# Patient Record
Sex: Male | Born: 1955 | Race: Black or African American | Hispanic: No | Marital: Single | Smoking: Current every day smoker
Health system: Southern US, Community
[De-identification: ages and names within clinical notes are randomized; demographics above are authoritative.]

## PROBLEM LIST (undated history)

## (undated) DIAGNOSIS — I10 Essential (primary) hypertension: Secondary | ICD-10-CM

## (undated) DIAGNOSIS — M109 Gout, unspecified: Secondary | ICD-10-CM

---

## 2021-01-01 ENCOUNTER — Encounter (HOSPITAL_BASED_OUTPATIENT_CLINIC_OR_DEPARTMENT_OTHER): Payer: Self-pay

## 2021-01-01 ENCOUNTER — Other Ambulatory Visit: Payer: Self-pay

## 2021-01-01 ENCOUNTER — Emergency Department (HOSPITAL_BASED_OUTPATIENT_CLINIC_OR_DEPARTMENT_OTHER): Payer: BC Managed Care – PPO

## 2021-01-01 ENCOUNTER — Inpatient Hospital Stay (HOSPITAL_BASED_OUTPATIENT_CLINIC_OR_DEPARTMENT_OTHER)
Admission: EM | Admit: 2021-01-01 | Discharge: 2021-01-03 | DRG: 247 | Disposition: A | Discharge status: Home / Self Care | Payer: BC Managed Care – PPO | Attending: Internal Medicine | Admitting: Internal Medicine

## 2021-01-01 DIAGNOSIS — E785 Hyperlipidemia, unspecified: Secondary | ICD-10-CM | POA: Diagnosis present

## 2021-01-01 DIAGNOSIS — Z8249 Family history of ischemic heart disease and other diseases of the circulatory system: Secondary | ICD-10-CM

## 2021-01-01 DIAGNOSIS — I214 Non-ST elevation (NSTEMI) myocardial infarction: Secondary | ICD-10-CM | POA: Diagnosis not present

## 2021-01-01 DIAGNOSIS — I25110 Atherosclerotic heart disease of native coronary artery with unstable angina pectoris: Secondary | ICD-10-CM | POA: Diagnosis present

## 2021-01-01 DIAGNOSIS — Z955 Presence of coronary angioplasty implant and graft: Secondary | ICD-10-CM

## 2021-01-01 DIAGNOSIS — Z20822 Contact with and (suspected) exposure to covid-19: Secondary | ICD-10-CM | POA: Diagnosis present

## 2021-01-01 DIAGNOSIS — E876 Hypokalemia: Secondary | ICD-10-CM | POA: Diagnosis present

## 2021-01-01 DIAGNOSIS — I10 Essential (primary) hypertension: Secondary | ICD-10-CM | POA: Diagnosis present

## 2021-01-01 DIAGNOSIS — M109 Gout, unspecified: Secondary | ICD-10-CM | POA: Diagnosis present

## 2021-01-01 DIAGNOSIS — F17210 Nicotine dependence, cigarettes, uncomplicated: Secondary | ICD-10-CM | POA: Diagnosis present

## 2021-01-01 HISTORY — DX: Essential (primary) hypertension: I10

## 2021-01-01 HISTORY — DX: Gout, unspecified: M10.9

## 2021-01-01 LAB — TROPONIN I (HIGH SENSITIVITY): Troponin I (High Sensitivity): 64 ng/L — ABNORMAL HIGH (ref <18)

## 2021-01-01 LAB — BASIC METABOLIC PANEL
Anion gap: 12 (ref 5–15)
BUN: 15 mg/dL (ref 8–23)
CO2: 22 mmol/L (ref 22–32)
Calcium: 8.3 mg/dL — ABNORMAL LOW (ref 8.9–10.3)
Chloride: 102 mmol/L (ref 98–111)
Creatinine, Ser: 1.06 mg/dL (ref 0.61–1.24)
GFR, Estimated: >60 mL/min (ref >60)
Glucose, Bld: 155 mg/dL — ABNORMAL HIGH (ref 70–99)
Potassium: 3.3 mmol/L — ABNORMAL LOW (ref 3.5–5.1)
Sodium: 136 mmol/L (ref 135–145)

## 2021-01-01 LAB — CBC
HCT: 36 % — ABNORMAL LOW (ref 39.0–52.0)
Hemoglobin: 11.5 g/dL — ABNORMAL LOW (ref 13.0–17.0)
MCH: 27.4 pg (ref 26.0–34.0)
MCHC: 31.9 g/dL (ref 30.0–36.0)
MCV: 85.9 fL (ref 80.0–100.0)
Platelets: 352 10*3/uL (ref 150–400)
RBC: 4.19 MIL/uL — ABNORMAL LOW (ref 4.22–5.81)
RDW: 15 % (ref 11.5–15.5)
WBC: 8.3 10*3/uL (ref 4.0–10.5)
nRBC: 0 % (ref 0.0–0.2)

## 2021-01-01 MED ORDER — ASPIRIN 81 MG PO CHEW
324.0000 mg | CHEWABLE_TABLET | Freq: Once | ORAL | Status: AC
  Administered 2021-01-01: 324 mg via ORAL
  Filled 2021-01-01: qty 4

## 2021-01-01 MED ORDER — ASPIRIN 81 MG PO CHEW: 324.0000 mg | CHEWABLE_TABLET | Freq: Once | ORAL | Status: DC

## 2021-01-01 NOTE — ED Provider Notes (Signed)
[facility] EMERGENCY DEPARTMENT Provider Note   CSN: [id number] Arrival date & time: 01/01/21  2239     History Chief Complaint  Patient presents with   Chest Pain    Treyshaun Charlett Nose Winnett is a 65 y.o. male.  Patient is a 65 year old male with no significant past medical history.  Patient presenting today for evaluation of chest discomfort.  He reports 3 episodes over the past 2 weeks where he has developed discomfort to the right center of his chest that radiates to his right arm and makes his right arm feel numb.  He denies shortness of breath, nausea, or diaphoresis.  These episodes last approximately 10 to 15 minutes, occur while at rest, and resolve spontaneously with no alleviating factors.  Patient has no prior cardiac history.  He does report history of elevated blood pressure and has been on medications in the past, however has not been on anything in quite some time.  Other cardiac risk factors include tobacco use.  Patient smokes 1 pack of cigarettes per day.  He also reports a history of heart issues with his mother and sister, but is not certain as to what the issues were.  The history is provided by the patient.  Chest Pain Pain location:  R chest and substernal area Pain quality: pressure   Pain radiates to:  R arm Pain severity:  Moderate Duration:  15 minutes Timing:  Intermittent Progression:  Worsening Chronicity:  New Relieved by:  Nothing Worsened by:  Nothing Ineffective treatments:  None tried     Past Medical History:  Diagnosis Date   Gout    Hypertension     There are no problems to display for this patient.   History reviewed. No pertinent surgical history.     No family history on file.  Social History   Tobacco Use   Smoking status: Every Day    Types: Cigarettes   Smokeless tobacco: Never  Substance Use Topics   Alcohol use: Not Currently   Drug use: Not Currently    Home Medications Prior to Admission medications    Not on File    Allergies    Patient has no known allergies.  Review of Systems   Review of Systems  Cardiovascular:  Positive for chest pain.  All other systems reviewed and are negative.  Physical Exam Updated Vital Signs BP (!) 144/85 (BP Location: Right Arm)   Pulse 80   Temp 98.6 F (37 C) (Oral)   Resp 20   Ht 5\' 4"  (1.626 m)   Wt 81.2 kg   SpO2 100%   BMI 30.73 kg/m   Physical Exam Vitals and nursing note reviewed.  Constitutional:      General: He is not in acute distress.    Appearance: He is well-developed. He is not diaphoretic.  HENT:     Head: Normocephalic and atraumatic.  Cardiovascular:     Rate and Rhythm: Normal rate and regular rhythm.     Heart sounds: No murmur heard.   No friction rub.  Pulmonary:     Effort: Pulmonary effort is normal. No respiratory distress.     Breath sounds: Normal breath sounds. No wheezing or rales.  Abdominal:     General: Bowel sounds are normal. There is no distension.     Palpations: Abdomen is soft.     Tenderness: There is no abdominal tenderness.  Musculoskeletal:        General: Normal range of motion.  Cervical back: Normal range of motion and neck supple.     Right lower leg: No tenderness. No edema.     Left lower leg: No tenderness. No edema.  Skin:    General: Skin is warm and dry.  Neurological:     Mental Status: He is alert and oriented to person, place, and time.     Coordination: Coordination normal.    ED Results / Procedures / Treatments   Labs (all labs ordered are listed, but only abnormal results are displayed) Labs Reviewed  BASIC METABOLIC PANEL  CBC  TROPONIN I (HIGH SENSITIVITY)    EKG None  Radiology DG Chest 2 View  Result Date: 01/01/2021 CLINICAL DATA:  Intermittent chest pain for 3 weeks. EXAM: CHEST - 2 VIEW COMPARISON:  None. FINDINGS: Upper normal heart size. Normal mediastinal contours with aortic atherosclerosis. There is trace fluid in the fissures without  significant sub pulmonic effusion. No pulmonary edema. No focal airspace disease. No pneumothorax. No acute osseous abnormalities are seen. IMPRESSION: Borderline cardiomegaly. Trace fluid in the fissures. Electronically Signed   By: Narda Rutherford M.D.   On: 01/01/2021 23:13    Procedures Procedures   Medications Ordered in ED Medications  aspirin chewable tablet 324 mg (has no administration in time range)    ED Course  I have reviewed the triage vital signs and the nursing notes.  Pertinent labs & imaging results that were available during my care of the patient were reviewed by me and considered in my medical decision making (see chart for details).    MDM Rules/Calculators/A&P  Patient is a 65 year old male with no prior cardiac history presenting with complaints of chest discomfort as described in the HPI.  His initial EKG shows T wave inversions suggestive of ischemia.  His initial troponin has returned at 64.  Care discussed with Dr. Joyce Gross from cardiology who agrees patient will likely require admission and catheterization.  As there are no progressive beds available, patient will be transferred to [facility] as an ER to ER transfer.  I have spoken with Dr. Preston Fleeting who agrees to accept the patient in transfer.  Per cardiology recommendations, patient has received aspirin, statin, beta-blocker, and started on a heparin drip.  CRITICAL CARE Performed by: Geoffery Lyons Total critical care time: 40 minutes Critical care time was exclusive of separately billable procedures and treating other patients. Critical care was necessary to treat or prevent imminent or life-threatening deterioration. Critical care was time spent personally by me on the following activities: development of treatment plan with patient and/or surrogate as well as nursing, discussions with consultants, evaluation of patient's response to treatment, examination of patient, obtaining history from patient or  surrogate, ordering and performing treatments and interventions, ordering and review of laboratory studies, ordering and review of radiographic studies, pulse oximetry and re-evaluation of patient's condition.   Final Clinical Impression(s) / ED Diagnoses Final diagnoses:  None    Rx / DC Orders ED Discharge Orders     None        Geoffery Lyons, MD 01/02/21 0301

## 2021-01-01 NOTE — ED Triage Notes (Signed)
Pt c/o intermittent CP x 3 weeks-denies fever/flu sx-NAD-steady gait

## 2021-01-02 ENCOUNTER — Emergency Department (HOSPITAL_COMMUNITY): Payer: BC Managed Care – PPO

## 2021-01-02 ENCOUNTER — Encounter (HOSPITAL_COMMUNITY): Payer: Self-pay | Admitting: Internal Medicine

## 2021-01-02 ENCOUNTER — Inpatient Hospital Stay (HOSPITAL_COMMUNITY)
Admission: EM | Disposition: A | Discharge status: Home / Self Care | Payer: Self-pay | Source: Home / Self Care | Attending: Internal Medicine

## 2021-01-02 DIAGNOSIS — I214 Non-ST elevation (NSTEMI) myocardial infarction: Secondary | ICD-10-CM

## 2021-01-02 DIAGNOSIS — E876 Hypokalemia: Secondary | ICD-10-CM | POA: Diagnosis present

## 2021-01-02 DIAGNOSIS — E785 Hyperlipidemia, unspecified: Secondary | ICD-10-CM | POA: Diagnosis present

## 2021-01-02 DIAGNOSIS — Z20822 Contact with and (suspected) exposure to covid-19: Secondary | ICD-10-CM | POA: Diagnosis present

## 2021-01-02 DIAGNOSIS — I10 Essential (primary) hypertension: Secondary | ICD-10-CM | POA: Diagnosis present

## 2021-01-02 DIAGNOSIS — I25110 Atherosclerotic heart disease of native coronary artery with unstable angina pectoris: Secondary | ICD-10-CM | POA: Diagnosis present

## 2021-01-02 DIAGNOSIS — M109 Gout, unspecified: Secondary | ICD-10-CM | POA: Diagnosis present

## 2021-01-02 DIAGNOSIS — Z8249 Family history of ischemic heart disease and other diseases of the circulatory system: Secondary | ICD-10-CM | POA: Diagnosis not present

## 2021-01-02 DIAGNOSIS — Z720 Tobacco use: Secondary | ICD-10-CM

## 2021-01-02 DIAGNOSIS — I2510 Atherosclerotic heart disease of native coronary artery without angina pectoris: Secondary | ICD-10-CM | POA: Diagnosis not present

## 2021-01-02 DIAGNOSIS — F17210 Nicotine dependence, cigarettes, uncomplicated: Secondary | ICD-10-CM | POA: Diagnosis present

## 2021-01-02 HISTORY — PX: CORONARY STENT INTERVENTION: CATH118234

## 2021-01-02 HISTORY — PX: LEFT HEART CATH AND CORONARY ANGIOGRAPHY: CATH118249

## 2021-01-02 LAB — ECHOCARDIOGRAM COMPLETE
Area-P 1/2: 2.66 cm2
Height: 64 in
S' Lateral: 3.6 cm
Single Plane A2C EF: 51.7 %
Weight: 2864 oz

## 2021-01-02 LAB — LIPID PANEL
Cholesterol: 232 mg/dL — ABNORMAL HIGH (ref 0–200)
HDL: 37 mg/dL — ABNORMAL LOW (ref >40)
LDL Cholesterol: 162 mg/dL — ABNORMAL HIGH (ref 0–99)
Total CHOL/HDL Ratio: 6.3 RATIO
Triglycerides: 166 mg/dL — ABNORMAL HIGH (ref <150)
VLDL: 33 mg/dL (ref 0–40)

## 2021-01-02 LAB — CBC
HCT: 35.4 % — ABNORMAL LOW (ref 39.0–52.0)
Hemoglobin: 11 g/dL — ABNORMAL LOW (ref 13.0–17.0)
MCH: 27.6 pg (ref 26.0–34.0)
MCHC: 31.1 g/dL (ref 30.0–36.0)
MCV: 88.9 fL (ref 80.0–100.0)
Platelets: 355 10*3/uL (ref 150–400)
RBC: 3.98 MIL/uL — ABNORMAL LOW (ref 4.22–5.81)
RDW: 15 % (ref 11.5–15.5)
WBC: 9.1 10*3/uL (ref 4.0–10.5)
nRBC: 0 % (ref 0.0–0.2)

## 2021-01-02 LAB — HEPARIN LEVEL (UNFRACTIONATED)
Heparin Unfractionated: 1.03 IU/mL — ABNORMAL HIGH (ref 0.30–0.70)
Heparin Unfractionated: <0.1 IU/mL — ABNORMAL LOW (ref 0.30–0.70)

## 2021-01-02 LAB — BASIC METABOLIC PANEL
Anion gap: 9 (ref 5–15)
BUN: 12 mg/dL (ref 8–23)
CO2: 24 mmol/L (ref 22–32)
Calcium: 8.7 mg/dL — ABNORMAL LOW (ref 8.9–10.3)
Chloride: 105 mmol/L (ref 98–111)
Creatinine, Ser: 0.89 mg/dL (ref 0.61–1.24)
GFR, Estimated: >60 mL/min (ref >60)
Glucose, Bld: 108 mg/dL — ABNORMAL HIGH (ref 70–99)
Potassium: 3.8 mmol/L (ref 3.5–5.1)
Sodium: 138 mmol/L (ref 135–145)

## 2021-01-02 LAB — RESP PANEL BY RT-PCR (FLU A&B, COVID) ARPGX2
Influenza A by PCR: NEGATIVE
Influenza B by PCR: NEGATIVE
SARS Coronavirus 2 by RT PCR: NEGATIVE

## 2021-01-02 LAB — TROPONIN I (HIGH SENSITIVITY): Troponin I (High Sensitivity): 135 ng/L (ref <18)

## 2021-01-02 LAB — HEMOGLOBIN A1C
Hgb A1c MFr Bld: 6.1 % — ABNORMAL HIGH (ref 4.8–5.6)
Mean Plasma Glucose: 128.37 mg/dL

## 2021-01-02 LAB — POCT ACTIVATED CLOTTING TIME
Activated Clotting Time: 231 seconds
Activated Clotting Time: 289 seconds

## 2021-01-02 LAB — HIV ANTIBODY (ROUTINE TESTING W REFLEX): HIV Screen 4th Generation wRfx: NONREACTIVE

## 2021-01-02 LAB — BRAIN NATRIURETIC PEPTIDE: B Natriuretic Peptide: 44.3 pg/mL (ref 0.0–100.0)

## 2021-01-02 SURGERY — LEFT HEART CATH AND CORONARY ANGIOGRAPHY
Anesthesia: LOCAL

## 2021-01-02 MED ORDER — NITROGLYCERIN 1 MG/10 ML FOR IR/CATH LAB
INTRA_ARTERIAL | Status: AC
  Filled 2021-01-02: qty 10

## 2021-01-02 MED ORDER — HEPARIN BOLUS VIA INFUSION
2000.0000 [IU] | Freq: Once | INTRAVENOUS | Status: AC
  Administered 2021-01-02: 2000 [IU] via INTRAVENOUS
  Filled 2021-01-02: qty 2000

## 2021-01-02 MED ORDER — SODIUM CHLORIDE 0.9 % WEIGHT BASED INFUSION: 1.0000 mL/kg/h | INTRAVENOUS | Status: DC

## 2021-01-02 MED ORDER — SODIUM CHLORIDE 0.9 % WEIGHT BASED INFUSION
3.0000 mL/kg/h | INTRAVENOUS | Status: AC
  Administered 2021-01-02: 3 mL/kg/h via INTRAVENOUS

## 2021-01-02 MED ORDER — HEPARIN (PORCINE) 25000 UT/250ML-% IV SOLN
1200.0000 [IU]/h | INTRAVENOUS | Status: DC
  Administered 2021-01-02: 1000 [IU]/h via INTRAVENOUS
  Filled 2021-01-02: qty 250

## 2021-01-02 MED ORDER — ACETAMINOPHEN 325 MG PO TABS: 650.0000 mg | ORAL_TABLET | ORAL | Status: DC | PRN

## 2021-01-02 MED ORDER — HEPARIN (PORCINE) IN NACL 1000-0.9 UT/500ML-% IV SOLN
INTRAVENOUS | Status: AC
  Filled 2021-01-02: qty 1000

## 2021-01-02 MED ORDER — IOHEXOL 350 MG/ML SOLN
INTRAVENOUS | Status: DC | PRN
  Administered 2021-01-02: 115 mL

## 2021-01-02 MED ORDER — ONDANSETRON HCL 4 MG/2ML IJ SOLN: 4.0000 mg | Freq: Four times a day (QID) | INTRAMUSCULAR | Status: DC | PRN

## 2021-01-02 MED ORDER — MIDAZOLAM HCL 2 MG/2ML IJ SOLN
INTRAMUSCULAR | Status: AC
  Filled 2021-01-02: qty 2

## 2021-01-02 MED ORDER — METOPROLOL TARTRATE 25 MG PO TABS
12.5000 mg | ORAL_TABLET | Freq: Once | ORAL | Status: AC
  Administered 2021-01-02: 12.5 mg via ORAL
  Filled 2021-01-02: qty 1

## 2021-01-02 MED ORDER — LIDOCAINE HCL (PF) 1 % IJ SOLN
INTRAMUSCULAR | Status: DC | PRN
  Administered 2021-01-02: 2 mL

## 2021-01-02 MED ORDER — FENTANYL CITRATE (PF) 100 MCG/2ML IJ SOLN
INTRAMUSCULAR | Status: AC
  Filled 2021-01-02: qty 2

## 2021-01-02 MED ORDER — MIDAZOLAM HCL 2 MG/2ML IJ SOLN
INTRAMUSCULAR | Status: DC | PRN
  Administered 2021-01-02: 2 mg via INTRAVENOUS
  Administered 2021-01-02: 1 mg via INTRAVENOUS

## 2021-01-02 MED ORDER — TICAGRELOR 90 MG PO TABS
ORAL_TABLET | ORAL | Status: AC
  Filled 2021-01-02: qty 2

## 2021-01-02 MED ORDER — TICAGRELOR 90 MG PO TABS
ORAL_TABLET | ORAL | Status: DC | PRN
  Administered 2021-01-02: 180 mg via ORAL

## 2021-01-02 MED ORDER — NITROGLYCERIN 1 MG/10 ML FOR IR/CATH LAB
INTRA_ARTERIAL | Status: DC | PRN
  Administered 2021-01-02 (×2): 200 ug via INTRACORONARY

## 2021-01-02 MED ORDER — HEPARIN SODIUM (PORCINE) 1000 UNIT/ML IJ SOLN
INTRAMUSCULAR | Status: AC
  Filled 2021-01-02: qty 1

## 2021-01-02 MED ORDER — HYDRALAZINE HCL 20 MG/ML IJ SOLN
10.0000 mg | INTRAMUSCULAR | Status: AC | PRN
  Administered 2021-01-02: 10 mg via INTRAVENOUS

## 2021-01-02 MED ORDER — LIDOCAINE HCL (PF) 1 % IJ SOLN
INTRAMUSCULAR | Status: AC
  Filled 2021-01-02: qty 30

## 2021-01-02 MED ORDER — SODIUM CHLORIDE 0.9% FLUSH: 3.0000 mL | INTRAVENOUS | Status: DC | PRN

## 2021-01-02 MED ORDER — POTASSIUM CHLORIDE CRYS ER 20 MEQ PO TBCR
40.0000 meq | EXTENDED_RELEASE_TABLET | Freq: Once | ORAL | Status: AC
  Administered 2021-01-02: 40 meq via ORAL
  Filled 2021-01-02: qty 2

## 2021-01-02 MED ORDER — HEPARIN BOLUS VIA INFUSION
4000.0000 [IU] | Freq: Once | INTRAVENOUS | Status: AC
  Administered 2021-01-02: 4000 [IU] via INTRAVENOUS

## 2021-01-02 MED ORDER — LABETALOL HCL 5 MG/ML IV SOLN
INTRAVENOUS | Status: AC
  Filled 2021-01-02: qty 4

## 2021-01-02 MED ORDER — VERAPAMIL HCL 2.5 MG/ML IV SOLN
INTRAVENOUS | Status: DC | PRN
  Administered 2021-01-02: 10 mL via INTRA_ARTERIAL

## 2021-01-02 MED ORDER — MORPHINE SULFATE (PF) 4 MG/ML IV SOLN: 2.0000 mg | INTRAVENOUS | Status: DC | PRN

## 2021-01-02 MED ORDER — SODIUM CHLORIDE 0.9 % IV SOLN: 250.0000 mL | INTRAVENOUS | Status: DC | PRN

## 2021-01-02 MED ORDER — TICAGRELOR 90 MG PO TABS
90.0000 mg | ORAL_TABLET | Freq: Two times a day (BID) | ORAL | Status: DC
  Administered 2021-01-03 (×2): 90 mg via ORAL
  Filled 2021-01-02 (×2): qty 1

## 2021-01-02 MED ORDER — SODIUM CHLORIDE 0.9 % WEIGHT BASED INFUSION
1.0000 mL/kg/h | INTRAVENOUS | Status: DC
  Administered 2021-01-02: 1 mL/kg/h via INTRAVENOUS

## 2021-01-02 MED ORDER — HEPARIN SODIUM (PORCINE) 1000 UNIT/ML IJ SOLN
INTRAMUSCULAR | Status: DC | PRN
  Administered 2021-01-02 (×3): 4000 [IU] via INTRAVENOUS

## 2021-01-02 MED ORDER — HEPARIN (PORCINE) IN NACL 1000-0.9 UT/500ML-% IV SOLN
INTRAVENOUS | Status: DC | PRN
  Administered 2021-01-02: 500 mL

## 2021-01-02 MED ORDER — ALPRAZOLAM 0.25 MG PO TABS
0.2500 mg | ORAL_TABLET | Freq: Every evening | ORAL | Status: AC | PRN
  Administered 2021-01-02: 0.25 mg via ORAL
  Filled 2021-01-02: qty 1

## 2021-01-02 MED ORDER — NITROGLYCERIN 0.4 MG SL SUBL: 0.4000 mg | SUBLINGUAL_TABLET | SUBLINGUAL | Status: DC | PRN

## 2021-01-02 MED ORDER — METOPROLOL TARTRATE 12.5 MG HALF TABLET: 12.5000 mg | ORAL_TABLET | Freq: Two times a day (BID) | ORAL | Status: DC

## 2021-01-02 MED ORDER — SODIUM CHLORIDE 0.9% FLUSH
3.0000 mL | Freq: Two times a day (BID) | INTRAVENOUS | Status: DC
  Administered 2021-01-03: 3 mL via INTRAVENOUS

## 2021-01-02 MED ORDER — ATORVASTATIN CALCIUM 80 MG PO TABS
80.0000 mg | ORAL_TABLET | Freq: Every day | ORAL | Status: DC
  Administered 2021-01-03: 80 mg via ORAL
  Filled 2021-01-02: qty 1

## 2021-01-02 MED ORDER — LABETALOL HCL 5 MG/ML IV SOLN
10.0000 mg | INTRAVENOUS | Status: AC | PRN
  Administered 2021-01-02: 10 mg via INTRAVENOUS

## 2021-01-02 MED ORDER — HYDRALAZINE HCL 20 MG/ML IJ SOLN
INTRAMUSCULAR | Status: AC
  Filled 2021-01-02: qty 1

## 2021-01-02 MED ORDER — HEPARIN (PORCINE) IN NACL 1000-0.9 UT/500ML-% IV SOLN
INTRAVENOUS | Status: DC | PRN
Start: 2021-01-02 — End: 2021-01-02
  Administered 2021-01-02: 500 mL

## 2021-01-02 MED ORDER — LABETALOL HCL 5 MG/ML IV SOLN
20.0000 mg | Freq: Once | INTRAVENOUS | Status: AC
  Administered 2021-01-02: 20 mg via INTRAVENOUS

## 2021-01-02 MED ORDER — FENTANYL CITRATE (PF) 100 MCG/2ML IJ SOLN
INTRAMUSCULAR | Status: DC | PRN
  Administered 2021-01-02: 50 ug via INTRAVENOUS
  Administered 2021-01-02 (×2): 25 ug via INTRAVENOUS

## 2021-01-02 MED ORDER — ASPIRIN 81 MG PO CHEW
81.0000 mg | CHEWABLE_TABLET | ORAL | Status: AC
  Administered 2021-01-02: 81 mg via ORAL
  Filled 2021-01-02: qty 1

## 2021-01-02 MED ORDER — ASPIRIN EC 81 MG PO TBEC
81.0000 mg | DELAYED_RELEASE_TABLET | Freq: Every day | ORAL | Status: DC
  Administered 2021-01-03: 81 mg via ORAL
  Filled 2021-01-02: qty 1

## 2021-01-02 MED ORDER — VERAPAMIL HCL 2.5 MG/ML IV SOLN
INTRAVENOUS | Status: AC
  Filled 2021-01-02: qty 2

## 2021-01-02 SURGICAL SUPPLY — 16 items
BALLN SAPPHIRE 2.5X12 (BALLOONS) ×2
BALLN SAPPHIRE ~~LOC~~ 3.0X8 (BALLOONS) ×2 IMPLANT
BALLOON SAPPHIRE 2.5X12 (BALLOONS) ×1 IMPLANT
CATH 5FR JL3.5 JR4 ANG PIG MP (CATHETERS) ×2 IMPLANT
CATH LAUNCHER 6FR EBU3.5 (CATHETERS) ×2 IMPLANT
DEVICE RAD COMP TR BAND LRG (VASCULAR PRODUCTS) ×2 IMPLANT
GLIDESHEATH SLEND SS 6F .021 (SHEATH) ×2 IMPLANT
GUIDEWIRE INQWIRE 1.5J.035X260 (WIRE) ×1 IMPLANT
INQWIRE 1.5J .035X260CM (WIRE) ×2
KIT ENCORE 26 ADVANTAGE (KITS) ×2 IMPLANT
KIT HEART LEFT (KITS) ×2 IMPLANT
PACK CARDIAC CATHETERIZATION (CUSTOM PROCEDURE TRAY) ×2 IMPLANT
STENT ONYX FRONTIER 2.75X12 (Permanent Stent) ×2 IMPLANT
TRANSDUCER W/STOPCOCK (MISCELLANEOUS) ×2 IMPLANT
TUBING CIL FLEX 10 FLL-RA (TUBING) ×2 IMPLANT
WIRE COUGAR XT STRL 190CM (WIRE) ×2 IMPLANT

## 2021-01-02 NOTE — Progress Notes (Signed)
ANTICOAGULATION CONSULT NOTE - Initial Consult  Pharmacy Consult for Heparin  Indication: chest pain/ACS  No Known Allergies  Patient Measurements: Height: 5\' 4"  (162.6 cm) Weight: 81.2 kg (179 lb) IBW/kg (Calculated) : 59.2  Vital Signs: Temp: 97.7 F (36.5 C) (08/03 0149) Temp Source: Oral (08/03 0149) BP: 132/89 (08/03 0149) Pulse Rate: 70 (08/03 0149)  Labs: Recent Labs    01/01/21 2314 01/02/21 0040  HGB 11.5*  --   HCT 36.0*  --   PLT 352  --   CREATININE 1.06  --   TROPONINIHS 64* 135*    Estimated Creatinine Clearance: 69.5 mL/min (by C-G formula based on SCr of 1.06 mg/dL).   Medical History: Past Medical History:  Diagnosis Date   Gout    Hypertension     Assessment: 65 y/o M with chest pain and elevated troponin. Starting heparin per pharmacy. Hgb 11.5. Renal function good. Trop 64>>135. No anti-coagulation PTA.    Goal of Therapy:  Heparin level 0.3-0.7 units/ml Monitor platelets by anticoagulation protocol: Yes   Plan:  Heparin 4000 units BOLUS Start heparin drip at 1000 units/hr 0800 Heparin level Daily CBC/Heparin level Monitor for bleeding  68, PharmD, BCPS Clinical Pharmacist Phone: [phone]

## 2021-01-02 NOTE — Progress Notes (Signed)
Resting quietly w/eyes closed. BP slowly coming down.

## 2021-01-02 NOTE — ED Provider Notes (Signed)
65 year old male received as a transfer from Dr. Judd Lien  "Henry Carson is a 65 y.o. male.   Patient is a 65 year old male with no significant past medical history.  Patient presenting today for evaluation of chest discomfort.  He reports 3 episodes over the past 2 weeks where he has developed discomfort to the right center of his chest that radiates to his right arm and makes his right arm feel numb.  He denies shortness of breath, nausea, or diaphoresis.  These episodes last approximately 10 to 15 minutes, occur while at rest, and resolve spontaneously with no alleviating factors.   Patient has no prior cardiac history.  He does report history of elevated blood pressure and has been on medications in the past, however has not been on anything in quite some time.  Other cardiac risk factors include tobacco use.  Patient smokes 1 pack of cigarettes per day.  He also reports a history of heart issues with his mother and sister, but is not certain as to what the issues were.   The history is provided by the patient.  Chest Pain Pain location:  R chest and substernal area Pain quality: pressure   Pain radiates to:  R arm Pain severity:  Moderate Duration:  15 minutes Timing:  Intermittent Progression:  Worsening Chronicity:  New Relieved by:  Nothing Worsened by:  Nothing Ineffective treatments:  None tried"  Physical Exam  BP 121/81   Pulse 69   Temp 97.7 F (36.5 C) (Oral)   Resp 17   Ht 5\' 4"  (1.626 m)   Wt 81.2 kg   SpO2 99%   BMI 30.73 kg/m   Physical Exam Vitals and nursing note reviewed.  Constitutional:      Appearance: He is well-developed.     Comments: Resting comfortably in bed.  No acute distress.  HENT:     Head: Normocephalic and atraumatic.  Cardiovascular:     Pulses: Normal pulses.     Heart sounds: Normal heart sounds. No murmur heard.   No friction rub. No gallop.  Pulmonary:     Effort: Pulmonary effort is normal.  Musculoskeletal:     Cervical  back: Neck supple.  Neurological:     Mental Status: He is alert and oriented to person, place, and time.     Cranial Nerves: No cranial nerve deficit.  Psychiatric:        Behavior: Behavior normal.    ED Course/Procedures     Procedures  MDM   65 year old male received as a transfer [facility].  He was initially seen by Dr. 68.  Please see his note for further work-up and medical decision making.  Patient presented with chest discomfort.  EKG showed T wave inversions suggestive of ischemia.  Initial troponin was 64 --> 135.  Patient has been seen by Dr. Judd Lien, cardiology, who will accept the patient for admission.  Patient has received ASA, statin, beta-blocker, and started on a heparin drip per cardiology recommendations.  The patient appears reasonably stabilized for admission considering the current resources, flow, and capabilities available in the ED at this time, and I doubt any other [facility] requiring further screening and/or treatment in the ED prior to admission.        [facility], PA-C 01/02/21 0410    03/04/21, MD 01/02/21 [phone]

## 2021-01-02 NOTE — Progress Notes (Addendum)
Progress Note  Patient Name: Henry Carson Date of Encounter: 01/02/2021  [facility] Cardiologist: None   Subjective   No chest pain this morning.   Inpatient Medications    Scheduled Meds:  aspirin  324 mg Oral Once   [START ON 01/03/2021] aspirin EC  81 mg Oral Daily   atorvastatin  80 mg Oral Daily   [START ON 01/03/2021] metoprolol tartrate  12.5 mg Oral BID   Continuous Infusions:  heparin 1,000 Units/hr (01/02/21 0309)   PRN Meds: acetaminophen, nitroGLYCERIN, ondansetron (ZOFRAN) IV   Vital Signs    Vitals:   01/02/21 0500 01/02/21 0600 01/02/21 0630 01/02/21 0700  BP: 118/76 120/69 124/70 136/69  Pulse: 64 69 66 64  Resp: 19 15 17 16   Temp:      TempSrc:      SpO2: 97% 99% 98% 99%  Weight:      Height:        Intake/Output Summary (Last 24 hours) at 01/02/2021 0742 Last data filed at 01/02/2021 0309 Gross per 24 hour  Intake 65.12 ml  Output --  Net 65.12 ml   Last 3 Weights 01/01/2021  Weight (lbs) 179 lb  Weight (kg) 81.194 kg      Telemetry    SR - Personally Reviewed  ECG    SR with deep TWI in anterolateral leads - Personally Reviewed  Physical Exam   GEN: No acute distress.   Neck: No JVD Cardiac: RRR, no murmurs, rubs, or gallops.  Respiratory: Clear to auscultation bilaterally. GI: Soft, nontender, non-distended  MS: No edema; No deformity. Neuro:  Nonfocal  Psych: Normal affect   Labs    High Sensitivity Troponin:   Recent Labs  Lab 01/01/21 2314 01/02/21 0040  TROPONINIHS 64* 135*      Chemistry Recent Labs  Lab 01/01/21 2314 01/02/21 0251  NA 136 138  K 3.3* 3.8  CL 102 105  CO2 22 24  GLUCOSE 155* 108*  BUN 15 12  CREATININE 1.06 0.89  CALCIUM 8.3* 8.7*  GFRNONAA >60 >60  ANIONGAP 12 9     Hematology Recent Labs  Lab 01/01/21 2314 01/02/21 0251  WBC 8.3 9.1  RBC 4.19* 3.98*  HGB 11.5* 11.0*  HCT 36.0* 35.4*  MCV 85.9 88.9  MCH 27.4 27.6  MCHC 31.9 31.1  RDW 15.0 15.0  PLT 352 355     BNP Recent Labs  Lab 01/01/21 2314  BNP 44.3     DDimer No results for input(s): DDIMER in the last 168 hours.   Radiology    DG Chest 2 View  Result Date: 01/01/2021 CLINICAL DATA:  Intermittent chest pain for 3 weeks. EXAM: CHEST - 2 VIEW COMPARISON:  None. FINDINGS: Upper normal heart size. Normal mediastinal contours with aortic atherosclerosis. There is trace fluid in the fissures without significant sub pulmonic effusion. No pulmonary edema. No focal airspace disease. No pneumothorax. No acute osseous abnormalities are seen. IMPRESSION: Borderline cardiomegaly. Trace fluid in the fissures. Electronically Signed   By: 03/03/2021 M.D.   On: 01/01/2021 23:13    Cardiac Studies   N/a   Patient Profile     65 y.o. male with PMH of HTN and gout who presented with NSTEMI.   Assessment & Plan    NSTEMI: hsTn 64>>135. No chest pain this morning. Reports episodes mostly developed with rest. He works in [employer]. EKG with deep TWI in anterolateral leads. RFs with tobacco use. He does not see a PCP on  regular basis.  -- currently on IV heparin, asa, statin and BB -- plan for cardiac cath today -- Lipids, Hgb A1c pending  Shared Decision Making/Informed Consent{ The risks [stroke (1 in 1000), death (1 in 1000), kidney failure [usually temporary] (1 in 500), bleeding (1 in 200), allergic reaction [possibly serious] (1 in 200)], benefits (diagnostic support and management of coronary artery disease) and alternatives of a cardiac catheterization were discussed in detail with Mr. Sobh and he is willing to proceed.  HTN: reports being told he has a hx of HTN, but has not been on any medications PTA -- currently on low dose BB  Hypokalemia: resolved  Tobacco use: cessation advised  For questions or updates, please contact [facility] Please consult [email or web address] for contact info under        Signed, Lindsay Roberts, NP  01/02/2021, 7:42 AM    Agree with note by  Lindsay Roberts NP-C  64-year-old divorced African-American male without prior cardiac history with risk factors that include greater than 50 pack years of ongoing tobacco abuse presented with unstable angina and non-STEMI.  He is currently pain-free on IV heparin.  His EKG shows diffuse deep anterolateral T wave inversion consistent with proximal LAD disease but I suspect he has multivessel disease.  Exam is benign.  Plan for left heart cath later today. The patient understands that risks included but are not limited to stroke (1 in 1000), death (1 in 1000), kidney failure [usually temporary] (1 in 500), bleeding (1 in 200), allergic reaction [possibly serious] (1 in 200).  The patient understands and agrees to proceed   [name] Geisel, M.D., FACP, FACC, FAHA, FSCAI [facility] [address] [place], [place]  [address]  [phone] 01/02/2021 9:21 AM   

## 2021-01-02 NOTE — Progress Notes (Signed)
  Echocardiogram 2D Echocardiogram has been performed.  Henry Carson 01/02/2021, 11:32 AM

## 2021-01-02 NOTE — H&P (View-Only) (Signed)
Progress Note  Patient Name: Henry Carson Date of Encounter: 01/02/2021  [facility] Cardiologist: None   Subjective   No chest pain this morning.   Inpatient Medications    Scheduled Meds:  aspirin  324 mg Oral Once   [START ON 01/03/2021] aspirin EC  81 mg Oral Daily   atorvastatin  80 mg Oral Daily   [START ON 01/03/2021] metoprolol tartrate  12.5 mg Oral BID   Continuous Infusions:  heparin 1,000 Units/hr (01/02/21 0309)   PRN Meds: acetaminophen, nitroGLYCERIN, ondansetron (ZOFRAN) IV   Vital Signs    Vitals:   01/02/21 0500 01/02/21 0600 01/02/21 0630 01/02/21 0700  BP: 118/76 120/69 124/70 136/69  Pulse: 64 69 66 64  Resp: 19 15 17 16   Temp:      TempSrc:      SpO2: 97% 99% 98% 99%  Weight:      Height:        Intake/Output Summary (Last 24 hours) at 01/02/2021 0742 Last data filed at 01/02/2021 0309 Gross per 24 hour  Intake 65.12 ml  Output --  Net 65.12 ml   Last 3 Weights 01/01/2021  Weight (lbs) 179 lb  Weight (kg) 81.194 kg      Telemetry    SR - Personally Reviewed  ECG    SR with deep TWI in anterolateral leads - Personally Reviewed  Physical Exam   GEN: No acute distress.   Neck: No JVD Cardiac: RRR, no murmurs, rubs, or gallops.  Respiratory: Clear to auscultation bilaterally. GI: Soft, nontender, non-distended  MS: No edema; No deformity. Neuro:  Nonfocal  Psych: Normal affect   Labs    High Sensitivity Troponin:   Recent Labs  Lab 01/01/21 2314 01/02/21 0040  TROPONINIHS 64* 135*      Chemistry Recent Labs  Lab 01/01/21 2314 01/02/21 0251  NA 136 138  K 3.3* 3.8  CL 102 105  CO2 22 24  GLUCOSE 155* 108*  BUN 15 12  CREATININE 1.06 0.89  CALCIUM 8.3* 8.7*  GFRNONAA >60 >60  ANIONGAP 12 9     Hematology Recent Labs  Lab 01/01/21 2314 01/02/21 0251  WBC 8.3 9.1  RBC 4.19* 3.98*  HGB 11.5* 11.0*  HCT 36.0* 35.4*  MCV 85.9 88.9  MCH 27.4 27.6  MCHC 31.9 31.1  RDW 15.0 15.0  PLT 352 355     BNP Recent Labs  Lab 01/01/21 2314  BNP 44.3     DDimer No results for input(s): DDIMER in the last 168 hours.   Radiology    DG Chest 2 View  Result Date: 01/01/2021 CLINICAL DATA:  Intermittent chest pain for 3 weeks. EXAM: CHEST - 2 VIEW COMPARISON:  None. FINDINGS: Upper normal heart size. Normal mediastinal contours with aortic atherosclerosis. There is trace fluid in the fissures without significant sub pulmonic effusion. No pulmonary edema. No focal airspace disease. No pneumothorax. No acute osseous abnormalities are seen. IMPRESSION: Borderline cardiomegaly. Trace fluid in the fissures. Electronically Signed   By: 03/03/2021 M.D.   On: 01/01/2021 23:13    Cardiac Studies   N/a   Patient Profile     65 y.o. male with PMH of HTN and gout who presented with NSTEMI.   Assessment & Plan    NSTEMI: hsTn 64>>135. No chest pain this morning. Reports episodes mostly developed with rest. He works in [employer]. EKG with deep TWI in anterolateral leads. RFs with tobacco use. He does not see a PCP on  regular basis.  -- currently on IV heparin, asa, statin and BB -- plan for cardiac cath today -- Lipids, Hgb A1c pending  Shared Decision Making/Informed Consent{ The risks [stroke (1 in 1000), death (1 in 1000), kidney failure [usually temporary] (1 in 500), bleeding (1 in 200), allergic reaction [possibly serious] (1 in 200)], benefits (diagnostic support and management of coronary artery disease) and alternatives of a cardiac catheterization were discussed in detail with Henry Carson and he is willing to proceed.  HTN: reports being told he has a hx of HTN, but has not been on any medications PTA -- currently on low dose BB  Hypokalemia: resolved  Tobacco use: cessation advised  For questions or updates, please contact [facility] Please consult [email or web address] for contact info under        Signed, Laverda Page, NP  01/02/2021, 7:42 AM    Agree with note by  Laverda Page NP-C  65 year old divorced African-American male without prior cardiac history with risk factors that include greater than 50 pack years of ongoing tobacco abuse presented with unstable angina and non-STEMI.  He is currently pain-free on IV heparin.  His EKG shows diffuse deep anterolateral T wave inversion consistent with proximal LAD disease but I suspect he has multivessel disease.  Exam is benign.  Plan for left heart cath later today. The patient understands that risks included but are not limited to stroke (1 in 1000), death (1 in 1000), kidney failure [usually temporary] (1 in 500), bleeding (1 in 200), allergic reaction [possibly serious] (1 in 200).  The patient understands and agrees to proceed   Runell Gess, M.D., FACP, [facility], Kathryne Eriksson [facility] [address] [place], [place]  [address]  [phone] 01/02/2021 9:21 AM

## 2021-01-02 NOTE — ED Notes (Signed)
Pt bib [facility] from [facility]. Pt presented with chest pain x3 weeks. Positive troponin of 135. Pt arrives on heparin gtt at 1000 units/hr. Pt not c/o any chest pain on arrival.

## 2021-01-02 NOTE — Progress Notes (Signed)
ANTICOAGULATION CONSULT NOTE  Pharmacy Consult for Heparin  Indication: chest pain/ACS  No Known Allergies  Patient Measurements: Height: 5\' 4"  (162.6 cm) Weight: 81.2 kg (179 lb) IBW/kg (Calculated) : 59.2  Vital Signs: Temp: 97.7 F (36.5 C) (08/03 0149) Temp Source: Oral (08/03 0149) BP: 136/69 (08/03 0700) Pulse Rate: 64 (08/03 0700)  Labs: Recent Labs    01/01/21 2314 01/02/21 0040 01/02/21 0251 01/02/21 0822  HGB 11.5*  --  11.0*  --   HCT 36.0*  --  35.4*  --   PLT 352  --  355  --   HEPARINUNFRC  --   --   --  1.03*  CREATININE 1.06  --  0.89  --   TROPONINIHS 64* 135*  --   --      Estimated Creatinine Clearance: 82.8 mL/min (by C-G formula based on SCr of 0.89 mg/dL).   Medical History: Past Medical History:  Diagnosis Date   Gout    Hypertension     Assessment: 65 y/o M with chest pain and elevated troponin. Starting heparin per pharmacy. Hgb 11.5. Renal function good. Trop 64>>135. No anti-coagulation PTA.   Initial heparin level came back elevated at 1.03 (drawn from same site of infusion, although flushed and heparin held for 20 minutes). Repeat heparin level drawn form opposite site of infusion at undetectable. Hgb 11, plt 355. No s/sx of bleeding or infusion issues.   Goal of Therapy:  Heparin level 0.3-0.7 units/ml Monitor platelets by anticoagulation protocol: Yes   Plan:  Heparin 2000 units BOLUS Increase heparin drip to 1200 units/hr Order heparin level in 6 hr Daily CBC/Heparin level Monitor for bleeding  68, PharmD, BCCCP Clinical Pharmacist  Phone: [phone] 01/02/2021 10:01 AM  Please check AMION for all [facility] Pharmacy phone numbers After 10:00 PM, call Main Pharmacy [phone]

## 2021-01-02 NOTE — ED Notes (Signed)
Pt to stress test

## 2021-01-02 NOTE — Plan of Care (Signed)

## 2021-01-02 NOTE — H&P (Signed)
Cardiology Admission History and Physical:   Patient ID: Henry Carson MRN: [id number]; DOB: 07/09/1955   Admission date: 01/01/2021  PCP:  Pcp, No   [facility] Providers Cardiologist:  None   Chief Complaint: "My chest started hurting tonight and then it came right back"  Patient Profile:   [name] is a 65 y.o. male with hx of essential hypertension and gout who is being seen 01/02/2021 for the evaluation of NSTEMI.  History of Present Illness:   [name] has no significant history of cardiovascular disease other than recently diagnosed hypertension for which she is currently not on any medication for.  His family history is notable for his sister who developed end-stage heart failure in her 16s requiring an LVAD and subsequent heart transplant.  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  He is quite active at home, doing all his yard work without any functionally limiting symptoms.  About 2 months ago he had an episode of right-sided chest pressure at rest that he had never had before.  This was associated with right arm numbness and tingling.  The episode resolved after about 10 to 15 minutes.  He had a second similar episode about 3 weeks ago, which similarly resolved after about 10 to 15 minutes.  He had a few other episodes over the last couple of weeks as well.  This evening he again developed right-sided chest pressure, and he stood up and walked around which did resolved the chest pressure after about 10 minutes.  He became concerned, however, because about an hour later the same pressure returned.  He has never had back-to-back episodes like this and was concerned enough that he drove himself to the emergency department.  All of these episodes have happened at rest.  There is no associated shortness of breath, fatigue, diaphoresis, nausea, or vomiting.  Of note he does smoke about 1 pack/day which she has done for many  years.  He previously would drink about 1/5 of alcohol on the weekends although he quit 6 months ago because it was leading to frequent gout flares.  On presentation to the emergency department Mr. Wolfer was hemodynamically stable and chest pain-free.  His initial EKG was quite concerning because of deep anterior precordial T wave inversions.  His initial troponin was positive at 64 which later trended up to 135.  He was initially seen at the med center ED and subsequently transferred here for further evaluation management.   Past Medical History:  Diagnosis Date   Gout    Hypertension     History reviewed. No pertinent surgical history.   Medications Prior to Admission: Prior to Admission medications   Not on File     Allergies:   No Known Allergies  Social History:   Social History   Socioeconomic History   Marital status: Single    Spouse name: Not on file   Number of children: Not on file   Years of education: Not on file   Highest education level: Not on file  Occupational History   Not on file  Tobacco Use   Smoking status: Every Day    Types: Cigarettes   Smokeless tobacco: Never  Substance and Sexual Activity   Alcohol use: Not Currently   Drug use: Not Currently   Sexual activity: Not on file  Other Topics Concern   Not on file  Social History Narrative   Not on file  Social Determinants of Health   Financial Resource Strain: Not on file  Food Insecurity: Not on file  Transportation Needs: Not on file  Physical Activity: Not on file  Stress: Not on file  Social Connections: Not on file  Intimate Partner Violence: Not on file    Family History:   The patient's family history includes Congestive Heart Failure in his sister; Heart attack in his mother.    ROS:  Please see the history of present illness.  All other ROS reviewed and negative.     Physical Exam/Data:   Vitals:   01/02/21 0021 01/02/21 0100 01/02/21 0149 01/02/21 0239  BP: 126/73  121/69 132/89 119/76  Pulse: 81 72 70 75  Resp:  18 16 20   Temp:   97.7 F (36.5 C)   TempSrc:   Oral   SpO2:  98% 99% 99%  Weight:      Height:       No intake or output data in the 24 hours ending 01/02/21 0247 Last 3 Weights 01/01/2021  Weight (lbs) 179 lb  Weight (kg) 81.194 kg     Body mass index is 30.73 kg/m.  General:  Well nourished, well developed, in no acute distress HEENT: normal Lymph: no adenopathy Neck: no JVD Endocrine:  No thryomegaly Vascular: No carotid bruits; FA pulses 2+ bilaterally without bruits  Cardiac:  normal S1, S2; RRR; no murmur  Lungs:  clear to auscultation bilaterally, no wheezing, rhonchi or rales  Abd: soft, nontender, no hepatomegaly  Ext: no edema Musculoskeletal:  No deformities, BUE and BLE strength normal and equal Skin: warm and dry  Neuro:  CNs 2-12 intact, no focal abnormalities noted Psych:  Normal affect   EKG:  The ECG that was done 01/02/21 was personally reviewed and demonstrates NSR, deep anterior precordial T wave inversions  Relevant CV Studies: None  Laboratory Data:  High Sensitivity Troponin:   Recent Labs  Lab 01/01/21 2314 01/02/21 0040  TROPONINIHS 64* 135*      Chemistry Recent Labs  Lab 01/01/21 2314  NA 136  K 3.3*  CL 102  CO2 22  GLUCOSE 155*  BUN 15  CREATININE 1.06  CALCIUM 8.3*  GFRNONAA >60  ANIONGAP 12    No results for input(s): PROT, ALBUMIN, AST, ALT, ALKPHOS, BILITOT in the last 168 hours. Hematology Recent Labs  Lab 01/01/21 2314  WBC 8.3  RBC 4.19*  HGB 11.5*  HCT 36.0*  MCV 85.9  MCH 27.4  MCHC 31.9  RDW 15.0  PLT 352   BNP Recent Labs  Lab 01/01/21 2314  BNP 44.3    DDimer No results for input(s): DDIMER in the last 168 hours.   Radiology/Studies:  DG Chest 2 View  Result Date: 01/01/2021 CLINICAL DATA:  Intermittent chest pain for 3 weeks. EXAM: CHEST - 2 VIEW COMPARISON:  None. FINDINGS: Upper normal heart size. Normal mediastinal contours with aortic  atherosclerosis. There is trace fluid in the fissures without significant sub pulmonic effusion. No pulmonary edema. No focal airspace disease. No pneumothorax. No acute osseous abnormalities are seen. IMPRESSION: Borderline cardiomegaly. Trace fluid in the fissures. Electronically Signed   By: 03/03/2021 M.D.   On: 01/01/2021 23:13     Assessment and Plan:   #NSTEMI: - Sputtering rest anginal pain over the last 2 months, with 2 back to back episodes this evening. Significant risk factors for CAD, including hypertension and long term tobacco abuse. Biomarkers elevated with deep anterior T wave inversions  -  Fortunately Mr. Bienkowski remains chest pain free with stable hemodynamics, but I suspect he likely has significant LAD disease  - Start heparin ggt - Already loaded with ASA. ASA 81 mg to start in the AM. Hold P2Y12 given potential for surgical disease  - Start atorva 80  - Start metoprolol 12.5 mg BID - Check A1c, lipids - Echo in the AM - LHC in the AM (keep NPO)  #Tobacco Abuse - Strongly encouraged complete cessation  - Would refer to smoking cessation at d/c   Risk Assessment/Risk Scores:  TIMI Risk Score for Unstable Angina or Non-ST Elevation MI:   The patient's TIMI risk score is 2, which indicates a 8% risk of all cause mortality, new or recurrent myocardial infarction or need for urgent revascularization in the next 14 days.{  Severity of Illness: The appropriate patient status for this patient is INPATIENT. Inpatient status is judged to be reasonable and necessary in order to provide the required intensity of service to ensure the patient's safety. The patient's presenting symptoms, physical exam findings, and initial radiographic and laboratory data in the context of their chronic comorbidities is felt to place them at high risk for further clinical deterioration. Furthermore, it is not anticipated that the patient will be medically stable for discharge from the hospital  within 2 midnights of admission. The following factors support the patient status of inpatient.   " The patient's presenting symptoms include chest pain. " The worrisome physical exam findings include N/A. " The initial radiographic and laboratory data are worrisome because of elevated troponin, T wave inversions. " The chronic co-morbidities include hypertension, tobacco abuse.   * I certify that at the point of admission it is my clinical judgment that the patient will require inpatient hospital care spanning beyond 2 midnights from the point of admission due to high intensity of service, high risk for further deterioration and high frequency of surveillance required.*   For questions or updates, please contact [facility] Please consult [email or web address] for contact info under     Signed, Livingston Diones, MD  01/02/2021 2:47 AM

## 2021-01-02 NOTE — Interval H&P Note (Signed)
Cath Lab Visit (complete for each Cath Lab visit)  Clinical Evaluation Leading to the Procedure:   ACS: Yes.    Non-ACS:    Anginal Classification: CCS IV  Anti-ischemic medical therapy: Minimal Therapy (1 class of medications)  Non-Invasive Test Results: No non-invasive testing performed  Prior CABG: No previous CABG      History and Physical Interval Note:  01/02/2021 2:37 PM  Henry Carson  has presented today for surgery, with the diagnosis of nstemi.  The various methods of treatment have been discussed with the patient and family. After consideration of risks, benefits and other options for treatment, the patient has consented to  Procedure(s): LEFT HEART CATH AND CORONARY ANGIOGRAPHY (N/A) as a surgical intervention.  The patient's history has been reviewed, patient examined, no change in status, stable for surgery.  I have reviewed the patient's chart and labs.  Questions were answered to the patient's satisfaction.     Tonny Bollman

## 2021-01-03 ENCOUNTER — Telehealth: Payer: Self-pay | Admitting: General Practice

## 2021-01-03 ENCOUNTER — Other Ambulatory Visit (HOSPITAL_COMMUNITY): Payer: Self-pay

## 2021-01-03 LAB — CBC
HCT: 36.3 % — ABNORMAL LOW (ref 39.0–52.0)
Hemoglobin: 11.6 g/dL — ABNORMAL LOW (ref 13.0–17.0)
MCH: 27.2 pg (ref 26.0–34.0)
MCHC: 32 g/dL (ref 30.0–36.0)
MCV: 85.2 fL (ref 80.0–100.0)
Platelets: 358 10*3/uL (ref 150–400)
RBC: 4.26 MIL/uL (ref 4.22–5.81)
RDW: 14.9 % (ref 11.5–15.5)
WBC: 8.3 10*3/uL (ref 4.0–10.5)
nRBC: 0 % (ref 0.0–0.2)

## 2021-01-03 LAB — BASIC METABOLIC PANEL
Anion gap: 10 (ref 5–15)
BUN: 9 mg/dL (ref 8–23)
CO2: 22 mmol/L (ref 22–32)
Calcium: 8.8 mg/dL — ABNORMAL LOW (ref 8.9–10.3)
Chloride: 103 mmol/L (ref 98–111)
Creatinine, Ser: 0.94 mg/dL (ref 0.61–1.24)
GFR, Estimated: >60 mL/min (ref >60)
Glucose, Bld: 86 mg/dL (ref 70–99)
Potassium: 3.6 mmol/L (ref 3.5–5.1)
Sodium: 135 mmol/L (ref 135–145)

## 2021-01-03 MED ORDER — ASPIRIN 81 MG PO TBEC
81.0000 mg | DELAYED_RELEASE_TABLET | Freq: Every day | ORAL | 3 refills | Status: AC
  Filled 2021-01-03: qty 90, 90d supply, fill #0

## 2021-01-03 MED ORDER — ATORVASTATIN CALCIUM 80 MG PO TABS
80.0000 mg | ORAL_TABLET | Freq: Every day | ORAL | 3 refills | Status: AC
  Filled 2021-01-03: qty 90, 90d supply, fill #0

## 2021-01-03 MED ORDER — NITROGLYCERIN 0.4 MG SL SUBL: 0.4000 mg | SUBLINGUAL_TABLET | SUBLINGUAL | 12 refills | Status: DC | PRN

## 2021-01-03 MED ORDER — METOPROLOL SUCCINATE ER 25 MG PO TB24: 25.0000 mg | ORAL_TABLET | Freq: Every day | ORAL | 3 refills | Status: DC

## 2021-01-03 MED ORDER — ASPIRIN 81 MG PO TBEC
81.0000 mg | DELAYED_RELEASE_TABLET | Freq: Every day | ORAL | 11 refills | Status: DC
Start: 2021-01-03 — End: 2021-01-03

## 2021-01-03 MED ORDER — ATORVASTATIN CALCIUM 80 MG PO TABS: 80.0000 mg | ORAL_TABLET | Freq: Every day | ORAL | 3 refills | Status: DC

## 2021-01-03 MED ORDER — METOPROLOL SUCCINATE ER 25 MG PO TB24
25.0000 mg | ORAL_TABLET | Freq: Every day | ORAL | Status: DC
  Administered 2021-01-03: 25 mg via ORAL
  Filled 2021-01-03: qty 1

## 2021-01-03 MED ORDER — NITROGLYCERIN 0.4 MG SL SUBL
0.4000 mg | SUBLINGUAL_TABLET | SUBLINGUAL | 12 refills | Status: AC | PRN
  Filled 2021-01-03: qty 25, 8d supply, fill #0

## 2021-01-03 MED ORDER — TICAGRELOR 90 MG PO TABS: 90.0000 mg | ORAL_TABLET | Freq: Two times a day (BID) | ORAL | 11 refills | Status: DC

## 2021-01-03 MED ORDER — METOPROLOL SUCCINATE ER 25 MG PO TB24
25.0000 mg | ORAL_TABLET | Freq: Every day | ORAL | 11 refills | Status: AC
  Filled 2021-01-03: qty 30, 30d supply, fill #0

## 2021-01-03 MED ORDER — TICAGRELOR 90 MG PO TABS
90.0000 mg | ORAL_TABLET | Freq: Two times a day (BID) | ORAL | 11 refills | Status: AC
  Filled 2021-01-03: qty 60, 30d supply, fill #0

## 2021-01-03 MED FILL — Labetalol HCl IV Soln Prefilled Syringe 20 MG/4ML (5 MG/ML): INTRAVENOUS | Qty: 4 | Status: AC

## 2021-01-03 NOTE — Telephone Encounter (Signed)
Encounter not needed

## 2021-01-03 NOTE — TOC Benefit Eligibility Note (Signed)
Transition of Care [facility]) Benefit Eligibility Note    Patient Details  Name: Henry Carson MRN: [id number] Date of Birth: [birth year]   Medication/Dose: BRILINTA  90 MG BID  Covered?: Yes  Tier: 3 Drug  Prescription Coverage Preferred Pharmacy: [facility]  Spoke with Person/Company/Phone Number:: ADRENNA   @ [employer] #  [phone]  Co-Pay: $45.00  Prior Approval: No  Deductible: Unmet Rosana Fret)       Mardene Sayer Phone Number: 01/03/2021, 12:11 PM

## 2021-01-03 NOTE — TOC Benefit Eligibility Note (Signed)
Patient Product/process development scientist completed.    The patient is currently admitted and upon discharge could be taking Brilinta 90 mg.  The current 30 day co-pay is, $25.00.   The patient is insured through [employer]     Roland Earl, CPhT Pharmacy Patient Advocate Specialist [facility] Antimicrobial Stewardship Team Direct Number: [phone]  Fax: [phone]

## 2021-01-03 NOTE — Care Management (Addendum)
01-03-21 2119 Benefits check submitted for Brilinta. Case Manager will follow for cost.   [id number] [facility] to provide the patient with 30 day free Brilinta. Staff RN to provide the patient with the Brilinta discount card.

## 2021-01-03 NOTE — Progress Notes (Signed)
CARDIAC REHAB PHASE I   PRE:  Rate/Rhythm: 80 SR  BP:  Sitting: 142/81      SaO2: 100 RA  MODE:  Ambulation: 470 ft   POST:  Rate/Rhythm: 99 SR  BP:  Sitting: 151/78    SaO2: 93 RA   Pt ambulated 448ft in hallway independently with slow, steady gait. Pt able to hold conversation throughout walk, denies CP, SOB, or dizziness. Pt educated on importance of ASA, Brilinta, statin, and NTG. Pt given heart healthy diet and smoking cessation tip sheet. Reviewed site care, restrictions, and exercise guidelines. Will refer to [facility] [place] at pts request as he spends most of his time here during the week.  [id number] Reynold Bowen, RN BSN 01/03/2021 10:13 AM

## 2021-01-03 NOTE — Discharge Summary (Addendum)
Discharge Summary    Patient ID: Henry Carson MRN: [id number]; DOB: Jan 19, 1956  Admit date: 01/01/2021 Discharge date: 01/03/2021  PCP:  Pcp, No   [facility] Providers Cardiologist:  Nanetta Batty, MD   {  Discharge Diagnoses    Active Problems:   NSTEMI (non-ST elevated myocardial infarction) [facility])   CAD   HLD   Mild LV Dysfunction   Diagnostic Studies/Procedures    Echo 01/03/2021 1. Left ventricular ejection fraction, by estimation, is 45 to 50%. The  left ventricle has mildly decreased function. The left ventricle  demonstrates regional wall motion abnormalities (see scoring  diagram/findings for description). Left ventricular  diastolic parameters are consistent with Grade I diastolic dysfunction  (impaired relaxation).   2. Right ventricular systolic function is normal. The right ventricular  size is normal.   3. Left atrial size was mildly dilated.   4. The mitral valve is normal in structure. Trivial mitral valve  regurgitation. No evidence of mitral stenosis.   5. The aortic valve is normal in structure. Aortic valve regurgitation is  not visualized. No aortic stenosis is present.   6. The inferior vena cava is normal in size with greater than 50%  respiratory variability, suggesting right atrial pressure of 3 mmHg.   CORONARY STENT INTERVENTION  LEFT HEART CATH AND CORONARY ANGIOGRAPHY    Conclusion      Mid LAD lesion is 80% stenosed.   2nd Diag lesion is 50% stenosed.   A drug-eluting stent was successfully placed using a STENT ONYX FRONTIER [id number].   Post intervention, there is a 0% residual stenosis.   1.  Severe stenosis of the mid LAD, treated successfully with PCI using a 2.75 x 12 mm resolute Onyx DES 2.  Moderate stenosis at the ostium of the second diagonal branch (large vessel) 3.  Widely patent left main, left circumflex, and RCA 4.  Mild segmental LV dysfunction noted on echo assessment with anteroapical akinesis, LVEF  approximately 45%   Recommend: Dual antiplatelet therapy with aspirin and ticagrelor x12 months without interruption as tolerated, aggressive secondary risk reduction measures   Diagnostic Dominance: Right    Intervention    History of Present Illness     Henry Carson is a 65 y.o. male with history of hypertension, gout, tobacco abuse and family history of CAD presented for evaluation of chest pain and found to have non-STEMI.  His family history is notable for his sister who developed end-stage heart failure in her 32s requiring an LVAD and subsequent heart transplant.   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  He is quite active at home, doing all his yard work without any functionally limiting symptoms.  About 2 months ago he had an episode of right-sided chest pressure at rest that he had never had before.  This was associated with right arm numbness and tingling.  The episode resolved after about 10 to 15 minutes.  He had a second similar episode about 3 weeks ago, which similarly resolved after about 10 to 15 minutes.  He had a few other episodes over the last couple of weeks as well.  on 8/2 he again developed right-sided chest pressure, and he stood up and walked around which did resolved the chest pressure after about 10 minutes.  He became concerned, however, because about an hour later the same pressure returned.  He has never had back-to-back episodes like this and was concerned enough  that he drove himself to the emergency department.  All of these episodes have happened at rest.  There is no associated shortness of breath, fatigue, diaphoresis, nausea, or vomiting.  Of note he does smoke about 1 pack/day which she has done for many years.  He previously would drink about 1/5 of alcohol on the weekends although he quit 6 months ago because it was leading to frequent gout flares.   On presentation to the emergency department [name] was hemodynamically stable and chest pain-free.  His initial EKG was quite concerning because of deep anterior precordial T wave inversions.  His initial troponin was positive at 64 which later trended up to 135.  He was initially seen at the med center ED and subsequently transferred here for further evaluation management.  Hospital Course     Consultants: None  hsTn 64>>135. EKG with deep TWI in anterolateral leads.  Treated with heparin without recurrent pain. Cath showed severe stenosis of the mid LAD, treated successfully with PCI using a 2.75 x 12 mm resolute Onyx DES. Moderate stenosis at the ostium of the second diagonal branch (large vessel) >> medical management.  No recurrent chest pain.  Echocardiogram showed mild LV dysfunction with EF of 45 to 50% and grade 1 diastolic dysfunction.  Metoprolol tartrate transition to succinate at discharge.  Uptitrate further in outpatient setting with addition of ACE or ARB if needed.  Hopefully EF will improve with revascularization and beta-blocker.  Started on high intensity statin with plan to repeat lipid panel and LFTs in 6 to 8 weeks.  LDL 162. Goal less than 70. He will ambulate with cardiac rehab prior to discharge.  Strongly encourage/recommended smoking cessation.  The patient been seen by Dr. Maricsa Sammons toAllyson Sabalday and deemed ready for discharge home. All follow-up appointments have been scheduled. Discharge medications are listed below.    Did the patient have an acute coronary syndrome (MI, NSTEMI, STEMI, etc) this admission?:  Yes                               AHA/ACC Clinical Performance & Quality Measures: Aspirin prescribed? - Yes ADP Receptor Inhibitor (Plavix/Clopidogrel, Brilinta/Ticagrelor or Effient/Prasugrel) prescribed (includes medically managed patients)? - Yes Beta Blocker prescribed? - Yes High Intensity Statin (Lipitor 40-80mg  or Crestor 20-40mg ) prescribed? - Yes EF assessed during THIS hospitalization? - Yes For EF <40%,  was ACEI/ARB prescribed? - Not Applicable (EF >/= 40%) For EF <40%, Aldosterone Antagonist (Spironolactone or Eplerenone) prescribed? - Not Applicable (EF >/= 40%) Cardiac Rehab Phase II ordered (including medically managed patients)? - Yes      _____________  Discharge Vitals Blood pressure 135/73, pulse 70, temperature 97.9 F (36.6 C), temperature source Oral, resp. rate 16, height 5\' 4"  (1.626 m), weight 81.2 kg, SpO2 100 %.  Filed Weights   01/01/21 2248  Weight: 81.2 kg    Labs & Radiologic Studies    CBC Recent Labs    01/02/21 0251 01/03/21 0247  WBC 9.1 8.3  HGB 11.0* 11.6*  HCT 35.4* 36.3*  MCV 88.9 85.2  PLT 355 358   Basic Metabolic Panel Recent Labs    16/03/9607/03/22 0251 01/03/21 0247  NA 138 135  K 3.8 3.6  CL 105 103  CO2 24 22  GLUCOSE 108* 86  BUN 12 9  CREATININE 0.89 0.94  CALCIUM 8.7* 8.8*    High Sensitivity Troponin:   Recent Labs  Lab 01/01/21 2314 01/02/21  0040  TROPONINIHS 64* 135*     Hemoglobin A1C Recent Labs    01/01/21 2314  HGBA1C 6.1*   Fasting Lipid Panel Recent Labs    01/01/21 2314  CHOL 232*  HDL 37*  LDLCALC 162*  TRIG 166*  CHOLHDL 6.3    _____________  DG Chest 2 View  Result Date: 01/01/2021 CLINICAL DATA:  Intermittent chest pain for 3 weeks. EXAM: CHEST - 2 VIEW COMPARISON:  None. FINDINGS: Upper normal heart size. Normal mediastinal contours with aortic atherosclerosis. There is trace fluid in the fissures without significant sub pulmonic effusion. No pulmonary edema. No focal airspace disease. No pneumothorax. No acute osseous abnormalities are seen. IMPRESSION: Borderline cardiomegaly. Trace fluid in the fissures. Electronically Signed   By: Narda Rutherford M.D.   On: 01/01/2021 23:13   CARDIAC CATHETERIZATION  Result Date: 01/02/2021 Formatting of this result is different from the original.   Mid LAD lesion is 80% stenosed.   2nd Diag lesion is 50% stenosed.   A drug-eluting stent was successfully  placed using a STENT ONYX FRONTIER [id number].   Post intervention, there is a 0% residual stenosis. 1.  Severe stenosis of the mid LAD, treated successfully with PCI using a 2.75 x 12 mm resolute Onyx DES 2.  Moderate stenosis at the ostium of the second diagonal branch (large vessel) 3.  Widely patent left main, left circumflex, and RCA 4.  Mild segmental LV dysfunction noted on echo assessment with anteroapical akinesis, LVEF approximately 45% Recommend: Dual antiplatelet therapy with aspirin and ticagrelor x12 months without interruption as tolerated, aggressive secondary risk reduction measures   ECHOCARDIOGRAM COMPLETE  Result Date: 01/02/2021    ECHOCARDIOGRAM REPORT   Patient Name:   Henry Carson [facility] Date of Exam: 01/02/2021 Medical Rec #:  [id number]           Height:       64.0 in Accession #:    [id number]          Weight:       179.0 lb Date of Birth:  08-05-1955          BSA:          1.866 m Patient Age:    64 years            BP:           136/69 mmHg Patient Gender: M                   HR:           59 bpm. Exam Location:  Inpatient Procedure: 2D Echo Indications:    NSTEMI  History:        Patient has no prior history of Echocardiogram examinations.  Sonographer:    Delcie Roch Referring Phys: [id number] CHRISTOPHER A WROBEL IMPRESSIONS  1. Left ventricular ejection fraction, by estimation, is 45 to 50%. The left ventricle has mildly decreased function. The left ventricle demonstrates regional wall motion abnormalities (see scoring diagram/findings for description). Left ventricular diastolic parameters are consistent with Grade I diastolic dysfunction (impaired relaxation).  2. Right ventricular systolic function is normal. The right ventricular size is normal.  3. Left atrial size was mildly dilated.  4. The mitral valve is normal in structure. Trivial mitral valve regurgitation. No evidence of mitral stenosis.  5. The aortic valve is normal in structure. Aortic valve regurgitation is not  visualized. No aortic stenosis is present.  6. The inferior vena cava is normal in size  with greater than 50% respiratory variability, suggesting right atrial pressure of 3 mmHg. FINDINGS  Left Ventricle: Left ventricular ejection fraction, by estimation, is 45 to 50%. The left ventricle has mildly decreased function. The left ventricle demonstrates regional wall motion abnormalities. The left ventricular internal cavity size was normal in size. There is no left ventricular hypertrophy. Left ventricular diastolic parameters are consistent with Grade I diastolic dysfunction (impaired relaxation).  LV Wall Scoring: The apical lateral segment, apical septal segment, and apex are akinetic. Right Ventricle: The right ventricular size is normal. No increase in right ventricular wall thickness. Right ventricular systolic function is normal. Left Atrium: Left atrial size was mildly dilated. Right Atrium: Right atrial size was normal in size. Pericardium: There is no evidence of pericardial effusion. Mitral Valve: The mitral valve is normal in structure. Trivial mitral valve regurgitation. No evidence of mitral valve stenosis. Tricuspid Valve: The tricuspid valve is normal in structure. Tricuspid valve regurgitation is not demonstrated. No evidence of tricuspid stenosis. Aortic Valve: The aortic valve is normal in structure. Aortic valve regurgitation is not visualized. No aortic stenosis is present. Pulmonic Valve: The pulmonic valve was normal in structure. Pulmonic valve regurgitation is not visualized. No evidence of pulmonic stenosis. Aorta: The aortic root is normal in size and structure. Venous: The inferior vena cava is normal in size with greater than 50% respiratory variability, suggesting right atrial pressure of 3 mmHg. IAS/Shunts: No atrial level shunt detected by color flow Doppler.  LEFT VENTRICLE PLAX 2D LVIDd:         5.30 cm      Diastology LVIDs:         3.60 cm      LV e' medial:    7.18 cm/s LV PW:          1.10 cm      LV E/e' medial:  9.1 LV IVS:        1.10 cm      LV e' lateral:   7.94 cm/s LVOT diam:     2.10 cm      LV E/e' lateral: 8.2 LV SV:         54 LV SV Index:   29 LVOT Area:     3.46 cm  LV Volumes (MOD) LV vol d, MOD A2C: 109.0 ml LV vol s, MOD A2C: 52.6 ml LV SV MOD A2C:     56.4 ml RIGHT VENTRICLE            IVC RV S prime:     7.72 cm/s  IVC diam: 1.50 cm TAPSE (M-mode): 2.2 cm LEFT ATRIUM             Index       RIGHT ATRIUM           Index LA diam:        3.50 cm 1.88 cm/m  RA Area:     15.20 cm LA Vol (A2C):   80.2 ml 42.97 ml/m RA Volume:   39.20 ml  21.00 ml/m LA Vol (A4C):   63.8 ml 34.19 ml/m LA Biplane Vol: 73.5 ml 39.38 ml/m  AORTIC VALVE LVOT Vmax:   72.00 cm/s LVOT Vmean:  45.000 cm/s LVOT VTI:    0.155 m  AORTA Ao Root diam: 2.90 cm Ao Asc diam:  3.10 cm MITRAL VALVE MV Area (PHT): 2.66 cm    SHUNTS MV Decel Time: 285 msec    Systemic VTI:  0.16 m MV E velocity: 65.10 cm/s  Systemic Diam: 2.10 cm MV A velocity: 53.10 cm/s MV E/A ratio:  1.23 Henry Schultz MD Electronically signed by Henry Schultz MD Signature Date/Time: 01/02/2021/2:29:05 PM    Final    Disposition   Pt is being discharged home today in good condition.  Follow-up Plans & Appointments     Follow-up Information     [facility] Follow up on 01/15/2021.   Specialty: Cardiology Why: @11 :15am for hospital follow up with Dr. PA/NP. Please arrive 15 minutes early Contact information: [address] [place] [phone]               Discharge Instructions     AMB Referral to Cardiac Rehabilitation - Phase II   Complete by: As directed    Diagnosis: NSTEMI   After initial evaluation and assessments completed: Virtual Based Care may be provided alone or in conjunction with Phase 2 Cardiac Rehab based on patient barriers.: Yes   Diet - low sodium heart healthy   Complete by: As directed    Discharge instructions    Complete by: As directed    NO HEAVY LIFTING (>10lbs) X 2 WEEKS. NO SEXUAL ACTIVITY X 2 WEEKS. NO DRIVING X 1 WEEK. NO SOAKING BATHS, HOT TUBS, POOLS, ETC., X 7 DAYS.  Return to work after seen in clinic.   Increase activity slowly   Complete by: As directed        Discharge Medications   Allergies as of 01/03/2021   No Known Allergies      Medication List     TAKE these medications    aspirin 81 MG EC tablet Take 1 tablet (81 mg total) by mouth daily. Swallow whole.   atorvastatin 80 MG tablet Commonly known as: LIPITOR Take 1 tablet (80 mg total) by mouth daily.   metoprolol succinate 25 MG 24 hr tablet Commonly known as: TOPROL-XL Take 1 tablet (25 mg total) by mouth daily.   nitroGLYCERIN 0.4 MG SL tablet Commonly known as: NITROSTAT Place 1 tablet (0.4 mg total) under the tongue every 5 (five) minutes x 3 doses as needed for chest pain.   ticagrelor 90 MG Tabs tablet Commonly known as: BRILINTA Take 1 tablet (90 mg total) by mouth 2 (two) times daily.           Outstanding Labs/Studies   Consider OP f/u labs 6-8 weeks given statin initiation this admission.   Duration of Discharge Encounter   Greater than 30 minutes including physician time.  Signed, 03/05/2021, PA 01/03/2021, 9:28 AM  Agree with note by 03/05/2021 PA-C  Mr. Morini was admitted with a non-STEMI.  His peak troponin was 135.  He underwent diagnostic coronary angiography by Dr. Shana Chute via the right radial approach revealing a high-grade ruptured plaque in the mid LAD after diagonal branch which was successfully stented.  He has mild LV dysfunction.  He is stable for discharge on DAPT (aspirin/Brilinta) uninterrupted for 12 months, beta-blocker, high-dose statin therapy.  We talked about smoking cessation.  We will recheck a 2D echo in 3 months.  Exam is benign.  Henry Carson, M.D., Henry Carson, [facility], [facility] [address] [employer], [place]  [place]  [phone] 01/03/2021 10:08 AM

## 2021-01-07 ENCOUNTER — Telehealth (HOSPITAL_COMMUNITY): Payer: Self-pay

## 2021-01-07 NOTE — Telephone Encounter (Signed)
Attempted to call patient in regards to Cardiac Rehab - LM on VM 

## 2021-01-07 NOTE — Telephone Encounter (Signed)
Pt insurance is active and benefits verified through [employer] Co-pay 0, DED $2,000/0 met, out of pocket $6,000/$33.13 met, co-insurance 20%. no pre-authorization required. Passport, 01/07/2021'@11' :35am, REF# [id number]   Will contact patient to see if he is interested in the Cardiac Rehab Program. If interested, patient will need to complete follow up appt. Once completed, patient will be contacted for scheduling upon review by the RN Navigator.

## 2021-01-07 NOTE — Telephone Encounter (Signed)
Pt called back and stated that he is interested in the cardiac rehab, I advised pt of his insurance benefits and went over how the program works and pt understood. Will call pt back once he has completed his f/u appt.

## 2021-01-14 ENCOUNTER — Other Ambulatory Visit (HOSPITAL_COMMUNITY): Payer: Self-pay

## 2021-01-14 ENCOUNTER — Telehealth (HOSPITAL_COMMUNITY): Payer: Self-pay | Admitting: Pharmacist

## 2021-01-14 NOTE — Telephone Encounter (Signed)
Pharmacy Transitions of Care Follow-up Telephone Call   Date of discharge: 01/03/2021  Discharge Diagnosis: MI   How have you been since you were released from the hospital?  Good   Medication changes made at discharge:  - START: asa, atorvastatin, metoprolol, Nitrostat and Brilinta  - STOPPED: N/A  - CHANGED: N/A   Medication changes verified by the patient?  (Yes/     Medication Accessibility:   Home [facility]: [facility] on [address]   Was the patient provided with refills on discharged medications? Yes    Have all prescriptions been transferred from [facility] to home pharmacy?  Yes   Is the patient able to afford medications? Yes Notable copays:  Eligible patient assistance:      Medication Review: TICAGRELOR (BRILINTA) Ticagrelor 90 mg BID initiated on 01/03/2021.  - Educated patient on expected duration of therapy of aspirin/ with ticagrelor. Advised patient that dose of ticagrelor will be /reduced after /. Aspirin will be /continued indefinitely/discontinued. - Discussed importance of taking medication around the same time every day, - Reviewed potential DDIs with patient - Advised patient of medications to avoid (NSAIDs, aspirin maintenance doses>100 mg daily) - Educated that Tylenol (acetaminophen) will be the preferred analgesic to prevent risk of bleeding  - Emphasized importance of monitoring for signs and symptoms of bleeding (abnormal bruising, prolonged bleeding, nose bleeds, bleeding from gums, discolored urine, black tarry stools)  - Educated patient to notify doctor if shortness of breath or abnormal heartbeat occur - Advised patient to alert all providers of antiplatelet therapy prior to starting a new medication or having a procedure    Follow-up Appointments:  If their condition worsens, is the pt aware to call PCP or go to the Emergency Dept.? Yes   Final Patient Assessment: Patient doing well.  Reviewed medications.  Transfers completed.

## 2021-01-14 NOTE — Telephone Encounter (Signed)
Unable to reach patient, LVM.

## 2021-01-14 NOTE — Progress Notes (Addendum)
Cardiology Office Note:    Date:  01/15/2021   ID:  Henry ClampLevern Mcoy Carson, DOB January 08, 1956, MRN [id number]  PCP:  Oneita HurtPcp, No   [facility] Providers Cardiologist:  Nanetta BattyJonathan Berry, MD      Referring MD: No ref. provider found   Follow-up for coronary artery disease status post NSTEMI with PCI and DES to his mid LAD.  History of Present Illness:    Henry Carson is a 65 y.o. male with a hx of non-STEMI, HLD, HTN, gout, tobacco abuse, and family history of coronary artery disease and mild LV dysfunction.  He presented to the emergency department on 01/02/2021.  He reported that 2 months prior he had an episode of right-sided chest pressure that he had never had before.  The episode was associated with right arm numbness and tingling.  It lasted for around 15-20 minutes and resolved.  He had another episode that was similar to the first around 3 weeks ago.  It again resolved without intervention in about 10 to 15 minutes.  He reported that on 01/01/2021 he began to develop right-sided chest pressure.  He stood up and walked around which resolved his chest pressure in about 10 minutes.  About an hour later he became concerned because the pressure returned.  He reported that he had never had back-to-back episodes.  This prompted him to drive himself to the emergency department.  He reported that all of his other episodes that happened while he was at rest.  He had no associated shortness of breath, fatigue, diaphoresis, nausea or vomiting.  He reported that he was smoking about 1 pack/day and had been on for many years.  He also had been drinking about 1/5 of alcohol on the weekends although he quit doing this 6 months prior because it was leading to frequent gout flares.  His initial EKG showed deep anterior precordial T wave inversions his initial troponin was positive at 64 and trended up to 135.  He was [facility] seen at the med center emergency department and [facility]  for cardiac catheterization on 01/02/2021.  He underwent PCI with DES x1 to his mid LAD.  He was also noted to have 50% stenosis in the second diagonal.  He presents to the clinic today for follow-up evaluation states he feels well.  He has been increasing his physical activity and reports that his energy level and breathing have improved.  He reports compliance with his medications.  He denies further instances of chest discomfort.  He reports that he has cut back on his smoking and is only smoked 4-5 cigarettes since being discharged from the hospital.  He requests nicotine patches.  He does not have any questions or is experiencing any side effects from his medications.  I will give him a salty 6 diet sheet, have him continue to increase his physical activity, prescribed nicotine patches, order fasting lipids and LFTs in 4 weeks, and have him follow-up with Dr. Allyson SabalBerry.  Today he denies chest pain, shortness of breath, lower extremity edema, fatigue, palpitations, melena, hematuria, hemoptysis, diaphoresis, weakness, presyncope, syncope, orthopnea, and PND.     Past Medical History:  Diagnosis Date   Gout    Hypertension     Past Surgical History:  Procedure Laterality Date   CORONARY STENT INTERVENTION N/A 01/02/2021   Procedure: CORONARY STENT INTERVENTION;  Surgeon: Tonny Bollmanooper, Michael, MD;  [facility];  Service: Cardiovascular;  Laterality: N/A;   LEFT HEART CATH  AND CORONARY ANGIOGRAPHY N/A 01/02/2021   Procedure: LEFT HEART CATH AND CORONARY ANGIOGRAPHY;  Surgeon: Tonny Bollman, MD;  Location: [facility] INVASIVE CV LAB;  Service: Cardiovascular;  Laterality: N/A;    Current Medications: Current Meds  Medication Sig   aspirin 81 MG EC tablet Take 1 tablet (81 mg total) by mouth daily. Swallow whole.   atorvastatin (LIPITOR) 80 MG tablet Take 1 tablet (80 mg total) by mouth daily.   metoprolol succinate (TOPROL-XL) 25 MG 24 hr tablet Take 1 tablet (25 mg total) by mouth daily.    nitroGLYCERIN (NITROSTAT) 0.4 MG SL tablet Place 1 tablet (0.4 mg total) under the tongue every 5 (five) minutes x 3 doses as needed for chest pain.   ticagrelor (BRILINTA) 90 MG TABS tablet Take 1 tablet (90 mg total) by mouth 2 (two) times daily.     Allergies:   Patient has no known allergies.   Social History   Socioeconomic History   Marital status: Single    Spouse name: Not on file   Number of children: Not on file   Years of education: Not on file   Highest education level: Not on file  Occupational History   Not on file  Tobacco Use   Smoking status: Every Day    Types: Cigarettes   Smokeless tobacco: Never  Substance and Sexual Activity   Alcohol use: Not Currently   Drug use: Not Currently   Sexual activity: Not on file  Other Topics Concern   Not on file  Social History Narrative   Not on file   Social Determinants of Health   Financial Resource Strain: Not on file  Food Insecurity: Not on file  Transportation Needs: Not on file  Physical Activity: Not on file  Stress: Not on file  Social Connections: Not on file     Family History: The patient's family history includes Congestive Heart Failure in his sister; Heart attack in his mother.  ROS:   Please see the history of present illness.      All other systems reviewed and are negative.   Risk Assessment/Calculations:           Physical Exam:    VS:  BP 126/76 (BP Location: Left Arm, Patient Position: Sitting, Cuff Size: Normal)   Pulse 66   Ht 5' 4.5" (1.638 m)   Wt 177 lb 6.4 oz (80.5 kg)   BMI 29.98 kg/m     Wt Readings from Last 3 Encounters:  01/15/21 177 lb 6.4 oz (80.5 kg)  01/01/21 179 lb (81.2 kg)     GEN:  Well nourished, well developed in no acute distress HEENT: Normal NECK: No JVD; No carotid bruits LYMPHATICS: No lymphadenopathy CARDIAC: RRR, no murmurs, rubs, gallops RESPIRATORY:  Clear to auscultation without rales, wheezing or rhonchi  ABDOMEN: Soft, non-tender,  non-distended MUSCULOSKELETAL:  No edema; No deformity  SKIN: Warm and dry NEUROLOGIC:  Alert and oriented x 3 PSYCHIATRIC:  Normal affect    EKGs/Labs/Other Studies Reviewed:    The following studies were reviewed today:   Echocardiogram 01/02/2021 IMPRESSIONS     1. Left ventricular ejection fraction, by estimation, is 45 to 50%. The  left ventricle has mildly decreased function. The left ventricle  demonstrates regional wall motion abnormalities (see scoring  diagram/findings for description). Left ventricular  diastolic parameters are consistent with Grade I diastolic dysfunction  (impaired relaxation).   2. Right ventricular systolic function is normal. The right ventricular  size is normal.  3. Left atrial size was mildly dilated.   4. The mitral valve is normal in structure. Trivial mitral valve  regurgitation. No evidence of mitral stenosis.   5. The aortic valve is normal in structure. Aortic valve regurgitation is  not visualized. No aortic stenosis is present.   6. The inferior vena cava is normal in size with greater than 50%  respiratory variability, suggesting right atrial pressure of 3 mmHg.  Cardiac catheterization 01/02/2021   Mid LAD lesion is 80% stenosed.   2nd Diag lesion is 50% stenosed.   A drug-eluting stent was successfully placed using a STENT ONYX FRONTIER [id number].   Post intervention, there is a 0% residual stenosis.   1.  Severe stenosis of the mid LAD, treated successfully with PCI using a 2.75 x 12 mm resolute Onyx DES 2.  Moderate stenosis at the ostium of the second diagonal branch (large vessel) 3.  Widely patent left main, left circumflex, and RCA 4.  Mild segmental LV dysfunction noted on echo assessment with anteroapical akinesis, LVEF approximately 45%   Recommend: Dual antiplatelet therapy with aspirin and ticagrelor x12 months without interruption as tolerated, aggressive secondary risk reduction measures  Diagnostic Dominance:  Right Intervention     EKG:  EKG is  ordered today.  The ekg ordered today demonstrates normal sinus rhythm 66 bpm T wave abnormality consider anterior ischemia.  Recent Labs: 01/01/2021: B Natriuretic Peptide 44.3 01/03/2021: BUN 9; Creatinine, Ser 0.94; Hemoglobin 11.6; Platelets 358; Potassium 3.6; Sodium 135  Recent Lipid Panel    Component Value Date/Time   CHOL 232 (H) 01/01/2021 2314   TRIG 166 (H) 01/01/2021 2314   HDL 37 (L) 01/01/2021 2314   CHOLHDL 6.3 01/01/2021 2314   VLDL 33 01/01/2021 2314   LDLCALC 162 (H) 01/01/2021 2314    ASSESSMENT & PLAN    NSTEMI-denies further episodes of chest and arm discomfort.  Underwent PCI with DES x1 to his mid LAD on 01/02/2021.  Catheterization showed second diagonal 50% stenosis. Continue aspirin, atorvastatin, metoprolol, Brilinta, nitroglycerin Heart healthy low-sodium diet-salty 6 given Increase physical activity as tolerated Letter for return to work without cardiac restrictions given  Hyperlipidemia-01/01/2021: Cholesterol 232; HDL 37; LDL Cholesterol 162; Triglycerides 166; VLDL 33 Continue aspirin, atorvastatin Heart healthy low-sodium high-fiber diet Increase physical activity as tolerated Repeat fasting lipids and LFTs in 4 weeks.  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 Yaun and he agrees to proceed.   Disposition: Follow-up with Dr. Allyson Sabal in 3-4 months.       Medication Adjustments/Labs and Tests Ordered: Current medicines are reviewed at length with the patient today.  Concerns regarding medicines are outlined above.  No orders of the defined types were placed in this encounter.  No orders of the defined types were placed in this encounter.   There are no Patient Instructions on file for this visit.   Signed, Thomasene Ripple  Mariesha Venturella, NP  01/15/2021 11:47 AM      Notice: This dictation was prepared with Dragon dictation along with smaller phrase technology. Any transcriptional errors that result from this process are unintentional and may not be corrected upon review.  I spent 14 minutes examining this patient, reviewing medications, and using patient centered shared decision making involving her cardiac care.  Prior to her visit I spent greater than 20 minutes reviewing her past medical history,  medications, and prior cardiac tests.

## 2021-01-15 ENCOUNTER — Encounter (HOSPITAL_BASED_OUTPATIENT_CLINIC_OR_DEPARTMENT_OTHER): Payer: Self-pay | Admitting: General Practice

## 2021-01-15 ENCOUNTER — Telehealth (HOSPITAL_BASED_OUTPATIENT_CLINIC_OR_DEPARTMENT_OTHER): Payer: Self-pay | Admitting: General Practice

## 2021-01-15 ENCOUNTER — Ambulatory Visit (HOSPITAL_BASED_OUTPATIENT_CLINIC_OR_DEPARTMENT_OTHER): Payer: BC Managed Care – PPO | Admitting: General Practice

## 2021-01-15 ENCOUNTER — Other Ambulatory Visit: Payer: Self-pay

## 2021-01-15 VITALS — BP 126/76 | HR 66 | Ht 64.5 in | Wt 177.4 lb

## 2021-01-15 DIAGNOSIS — E782 Mixed hyperlipidemia: Secondary | ICD-10-CM

## 2021-01-15 DIAGNOSIS — Z720 Tobacco use: Secondary | ICD-10-CM

## 2021-01-15 DIAGNOSIS — I214 Non-ST elevation (NSTEMI) myocardial infarction: Secondary | ICD-10-CM

## 2021-01-15 MED ORDER — NICOTINE 7 MG/24HR TD PT24: 7.0000 mg | MEDICATED_PATCH | Freq: Every day | TRANSDERMAL | 0 refills | Status: AC

## 2021-01-15 MED ORDER — NICOTINE 21 MG/24HR TD PT24: 21.0000 mg | MEDICATED_PATCH | Freq: Every day | TRANSDERMAL | 0 refills | Status: AC

## 2021-01-15 MED ORDER — NICOTINE 14 MG/24HR TD PT24: 14.0000 mg | MEDICATED_PATCH | Freq: Every day | TRANSDERMAL | 0 refills | Status: AC

## 2021-01-15 NOTE — Patient Instructions (Signed)
Medication Instructions:   START Nicotine patch. Take 21 mg patch---one patch daily for 1st 6 weeks. Then, take 14 mg patch for weeks 7-8 (14 days). Lastly, use 7 mg patches daily for weeks 9-10 (last 14 days). Discontinue use when 7 mg patches completed.  *If you need a refill on your cardiac medications before your next appointment, please call your pharmacy*   Lab Work: Your physician recommends that you return for a FASTING lipid profile and hepatic function panel in 4 weeks.  If you have labs (blood work) drawn today and your tests are completely normal, you will receive your results only by: MyChart Message (if you have MyChart) OR A paper copy in the mail If you have any lab test that is abnormal or we need to change your treatment, we will call you to review the results.  Follow-Up: At [facility], you and your health needs are our priority.  As part of our continuing mission to provide you with exceptional heart care, we have created designated Provider Care Teams.  These Care Teams include your primary Cardiologist (physician) and Advanced Practice Providers (APPs -  Physician Assistants and Nurse Practitioners) who all work together to provide you with the care you need, when you need it.  We recommend signing up for the patient portal called "MyChart".  Sign up information is provided on this After Visit Summary.  MyChart is used to connect with patients for Virtual Visits (Telemedicine).  Patients are able to view lab/test results, encounter notes, upcoming appointments, etc.  Non-urgent messages can be sent to your provider as well.   To learn more about what you can do with MyChart, go to [email or web address].    Your next appointment:   3-4 month(s)  The format for your next appointment:   In Person  Provider:   You may see Nanetta Batty, MD or one of the following Advanced Practice Providers on your designated Care Team:   Tucker, PA-C Edd Fabian,  FNP   Other Instructions    High-Fiber Eating Plan Fiber, also called dietary fiber, is a type of carbohydrate. It is found foods such as fruits, vegetables, whole grains, and beans. A high-fiber diet can have many health benefits. Your health care provider may recommend a high-fiber diet to help: Prevent constipation. Fiber can make your bowel movements more regular. Lower your cholesterol. Relieve the following conditions: Inflammation of veins in the anus (hemorrhoids). Inflammation of specific areas of the digestive tract (uncomplicated diverticulosis). A problem of the large intestine, also called the colon, that sometimes causes pain and diarrhea (irritable bowel syndrome, or IBS). Prevent overeating as part of a weight-loss plan. Prevent heart disease, type 2 diabetes, and certain cancers. What are tips for following this plan? Reading food labels  Check the nutrition facts label on food products for the amount of dietary fiber. Choose foods that have 5 grams of fiber or more per serving. The goals for recommended daily fiber intake include: Men (age 57 or younger): 34-38 g. Men (over age 40): 28-34 g. Women (age 110 or younger): 25-28 g. Women (over age 1): 22-25 g. Your daily fiber goal is _____________ g. Shopping Choose whole fruits and vegetables instead of processed forms, such as apple juice or applesauce. Choose a wide variety of high-fiber foods such as avocados, lentils, oats, and kidney beans. Read the nutrition facts label of the foods you choose. Be aware of foods with added fiber. These foods often have high sugar and sodium amounts  per serving. Cooking Use whole-grain flour for baking and cooking. Cook with brown rice instead of white rice. Meal planning Start the day with a breakfast that is high in fiber, such as a cereal that contains 5 g of fiber or more per serving. Eat breads and cereals that are made with whole-grain flour instead of refined flour or  white flour. Eat brown rice, bulgur wheat, or millet instead of white rice. Use beans in place of meat in soups, salads, and pasta dishes. Be sure that half of the grains you eat each day are whole grains. General information You can get the recommended daily intake of dietary fiber by: Eating a variety of fruits, vegetables, grains, nuts, and beans. Taking a fiber supplement if you are not able to take in enough fiber in your diet. It is better to get fiber through food than from a supplement. Gradually increase how much fiber you consume. If you increase your intake of dietary fiber too quickly, you may have bloating, cramping, or gas. Drink plenty of water to help you digest fiber. Choose high-fiber snacks, such as berries, raw vegetables, nuts, and popcorn. What foods should I eat? Fruits Berries. Pears. Apples. Oranges. Avocado. Prunes and raisins. Dried figs. Vegetables Sweet potatoes. Spinach. Kale. Artichokes. Cabbage. Broccoli. Cauliflower.Green peas. Carrots. Squash. Grains Whole-grain breads. Multigrain cereal. Oats and oatmeal. Brown rice. Barley.Bulgur wheat. Millet. Quinoa. Bran muffins. Popcorn. Rye wafer crackers. Meats and other proteins Navy beans, kidney beans, and pinto beans. Soybeans. Split peas. Lentils. Nutsand seeds. Dairy Fiber-fortified yogurt. Beverages Fiber-fortified soy milk. Fiber-fortified orange juice. Other foods Fiber bars. The items listed above may not be a complete list of recommended foods and beverages. Contact a dietitian for more information. What foods should I avoid? Fruits Fruit juice. Cooked, strained fruit. Vegetables Fried potatoes. Canned vegetables. Well-cooked vegetables. Grains White bread. Pasta made with refined flour. White rice. Meats and other proteins Fatty cuts of meat. Fried chicken or fried fish. Dairy Milk. Yogurt. Cream cheese. Sour cream. Fats and oils Butters. Beverages Soft drinks. Other foods Cakes and  pastries. The items listed above may not be a complete list of foods and beverages to avoid. Talk with your dietitian about what choices are best for you. Summary Fiber is a type of carbohydrate. It is found in foods such as fruits, vegetables, whole grains, and beans. A high-fiber diet has many benefits. It can help to prevent constipation, lower blood cholesterol, aid weight loss, and reduce your risk of heart disease, diabetes, and certain cancers. Increase your intake of fiber gradually. Increasing fiber too quickly may cause cramping, bloating, and gas. Drink plenty of water while you increase the amount of fiber you consume. The best sources of fiber include whole fruits and vegetables, whole grains, nuts, seeds, and beans. This information is not intended to replace advice given to you by your health care provider. Make sure you discuss any questions you have with your healthcare provider. Document Revised: 09/22/2019 Document Reviewed: 09/22/2019 Elsevier Patient Education  2022 [employer].

## 2021-01-15 NOTE — Telephone Encounter (Signed)
Spoke with patient concerning the 3-4 month follow up appointment with Dr. Allyson Sabal per the 01/15/21 AVS from Edd Fabian, NP--scheduled Friday 04/12/21 at 10:00 am.  Will mail information to patient and he voiced his understanding.

## 2021-02-06 ENCOUNTER — Telehealth: Payer: Self-pay | Admitting: Cardiovascular Disease

## 2021-02-06 DIAGNOSIS — Z024 Encounter for examination for driving license: Secondary | ICD-10-CM

## 2021-02-06 NOTE — Telephone Encounter (Signed)
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 He was told that he needs to have a stress test. There are no orders in the patient's chart  Please order

## 2021-02-06 NOTE — Telephone Encounter (Signed)
Done

## 2021-02-07 NOTE — Addendum Note (Signed)
Addended by: Ronney Asters on: 02/07/2021 12:29 PM   Modules accepted: Orders

## 2021-02-08 ENCOUNTER — Telehealth (HOSPITAL_COMMUNITY): Payer: Self-pay | Admitting: *Deleted

## 2021-02-08 ENCOUNTER — Encounter (HOSPITAL_COMMUNITY): Payer: BC Managed Care – PPO

## 2021-02-08 NOTE — Telephone Encounter (Signed)
Close encounter 

## 2021-02-12 ENCOUNTER — Other Ambulatory Visit: Payer: Self-pay

## 2021-02-12 ENCOUNTER — Ambulatory Visit (HOSPITAL_COMMUNITY)
Admission: RE | Admit: 2021-02-12 | Discharge: 2021-02-12 | Disposition: A | Discharge status: Home / Self Care | Payer: BC Managed Care – PPO | Source: Ambulatory Visit | Attending: Internal Medicine | Admitting: Internal Medicine

## 2021-02-12 DIAGNOSIS — Z024 Encounter for examination for driving license: Secondary | ICD-10-CM | POA: Diagnosis present

## 2021-02-12 LAB — MYOCARDIAL PERFUSION IMAGING
Base ST Depression (mm): 0 mm
LV dias vol: 156 mL (ref 62–150)
LV sys vol: 81 mL
Nuc Stress EF: 48 %
Peak HR: 101 {beats}/min
Rest HR: 67 {beats}/min
Rest Nuclear Isotope Dose: 10.4 mCi
SDS: 2
SRS: 2
SSS: 4
ST Depression (mm): 0.5 mm
Stress Nuclear Isotope Dose: 29.6 mCi
TID: 1.08

## 2021-02-12 MED ORDER — TECHNETIUM TC 99M TETROFOSMIN IV KIT
10.4000 | PACK | Freq: Once | INTRAVENOUS | Status: AC | PRN
  Administered 2021-02-12: 10.4 via INTRAVENOUS
  Filled 2021-02-12: qty 11

## 2021-02-12 MED ORDER — TECHNETIUM TC 99M TETROFOSMIN IV KIT
29.6000 | PACK | Freq: Once | INTRAVENOUS | Status: AC | PRN
  Administered 2021-02-12: 29.6 via INTRAVENOUS
  Filled 2021-02-12: qty 30

## 2021-02-12 MED ORDER — REGADENOSON 0.4 MG/5ML IV SOLN
0.4000 mg | Freq: Once | INTRAVENOUS | Status: AC
  Administered 2021-02-12: 0.4 mg via INTRAVENOUS

## 2021-02-14 ENCOUNTER — Telehealth: Payer: Self-pay | Admitting: General Practice

## 2021-02-14 NOTE — Progress Notes (Signed)
See result note.  

## 2021-02-14 NOTE — Telephone Encounter (Signed)
Patient said to fax stress test results to [phone]

## 2021-02-14 NOTE — Telephone Encounter (Signed)
Patient is calling to get results from stress test

## 2021-02-14 NOTE — Telephone Encounter (Signed)
Ok for DOT driving?

## 2021-02-14 NOTE — Telephone Encounter (Signed)
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

## 2021-02-20 NOTE — Telephone Encounter (Signed)
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   Ronney Asters, NP 02/12/2021  8:06 PM EDT Please contact Mr.Hubka and let him know that his stress test has been reviewed.  It showed no evidence of ischemia.  His left ventricular function was abnormal.  However, when compared with his recent echocardiogram is left ventricular ejection fraction was in the low normal range.  No regional wall motion abnormalities were noted.  No further testing is needed at this time.  We will continue with his current medication regimen.  Thank you.  02-20-2021 Spoke with pt informed that note was faxed to number provided [phone], he states that he will go to office and see if they received stress test result and call back if not received. He asks to call back in "10 min"

## 2021-02-21 ENCOUNTER — Telehealth (HOSPITAL_COMMUNITY): Payer: Self-pay

## 2021-02-21 NOTE — Telephone Encounter (Signed)
Attempted to call patient in regards to Cardiac Rehab - LM on VM Mailed letter 

## 2021-02-21 NOTE — Telephone Encounter (Signed)
Pt returned CR phone call and stated he is not interested in CR at this time.  Closed referral 

## 2021-04-12 ENCOUNTER — Ambulatory Visit: Payer: BC Managed Care – PPO | Admitting: Cardiovascular Disease

## 2023-06-03 IMAGING — DX DG CHEST 2V
2 series · 2 of 2 positions shown · non-contrast
Comparison: None.

CLINICAL DATA: Intermittent chest pain for 3 weeks.

EXAM:
CHEST - 2 VIEW

[chest pa]
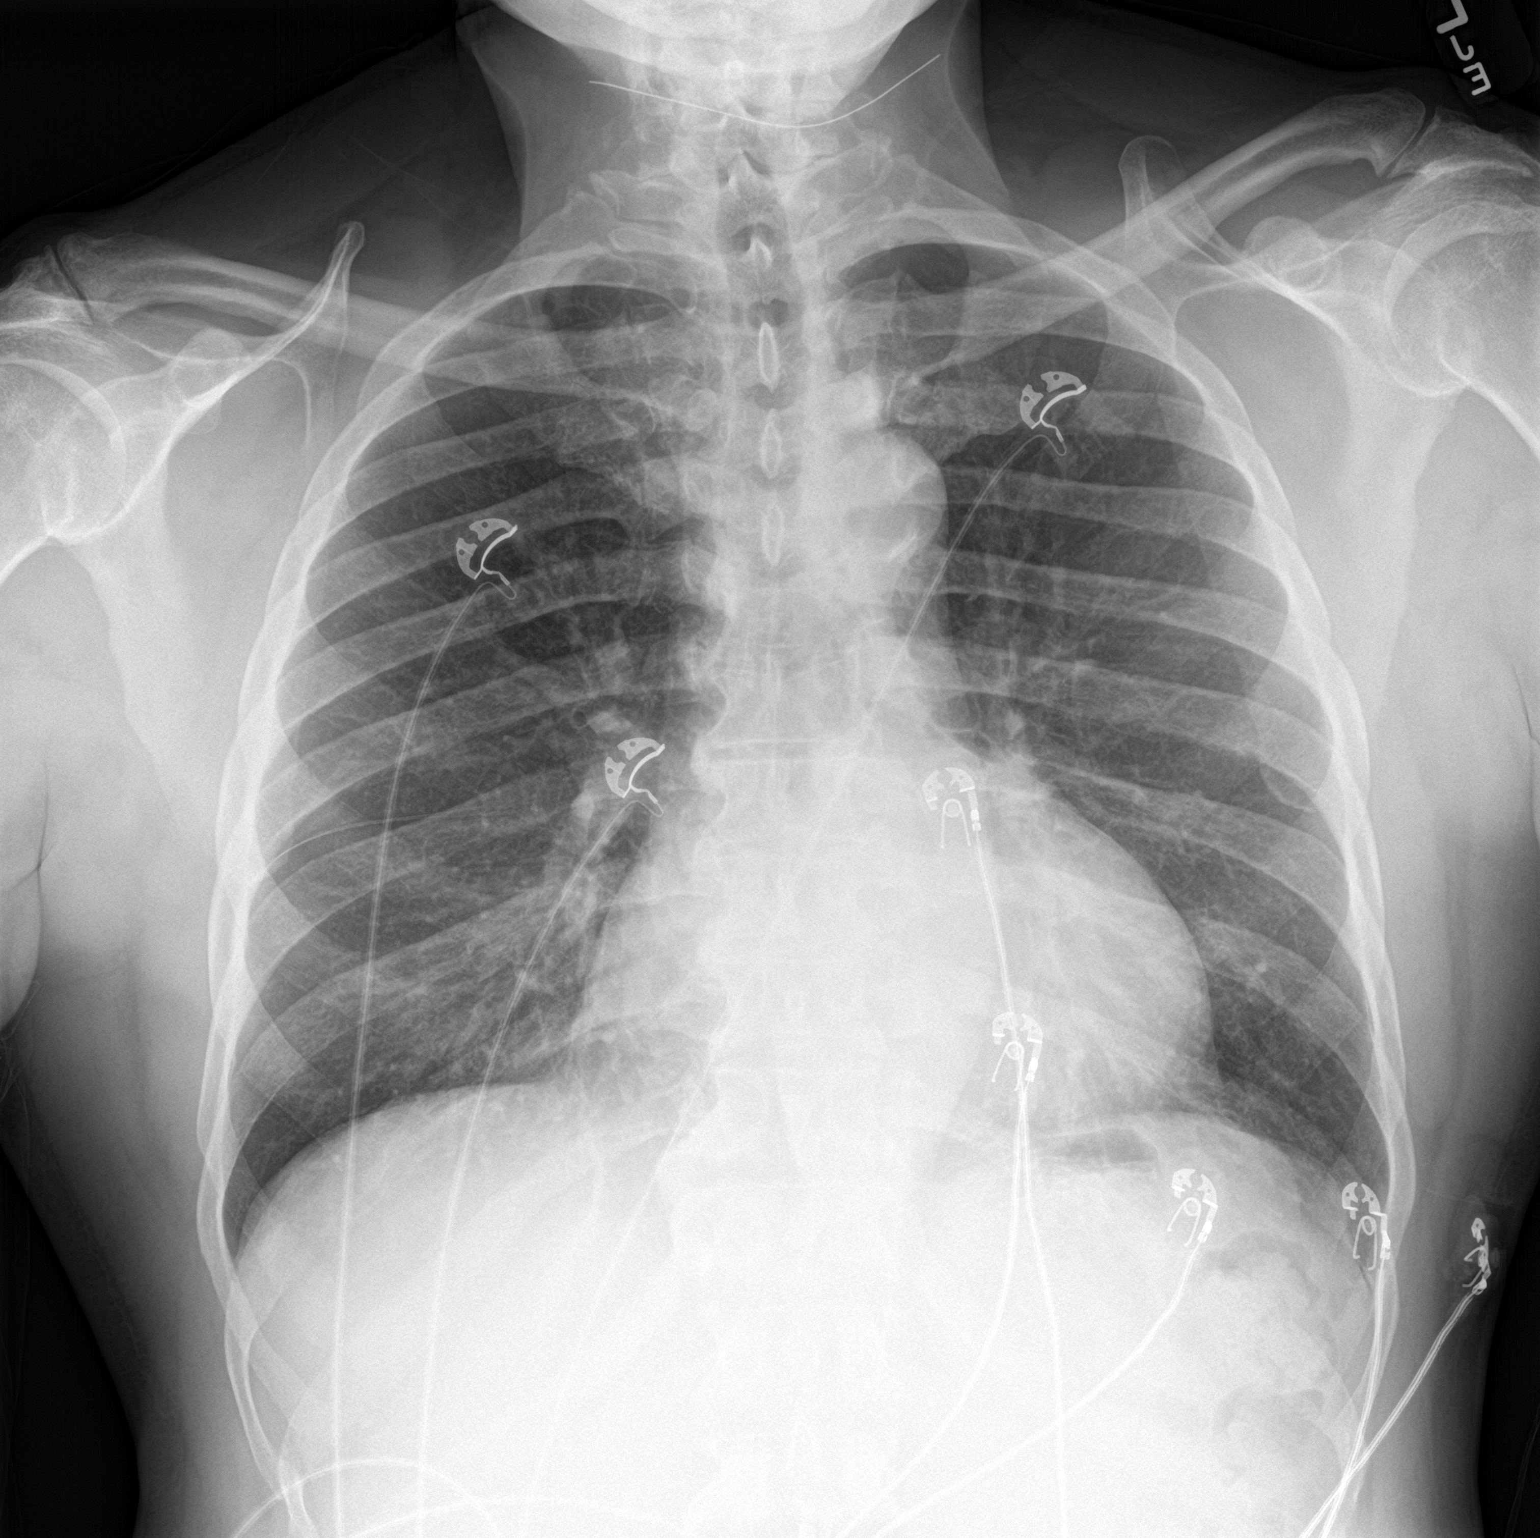

[chest lat]
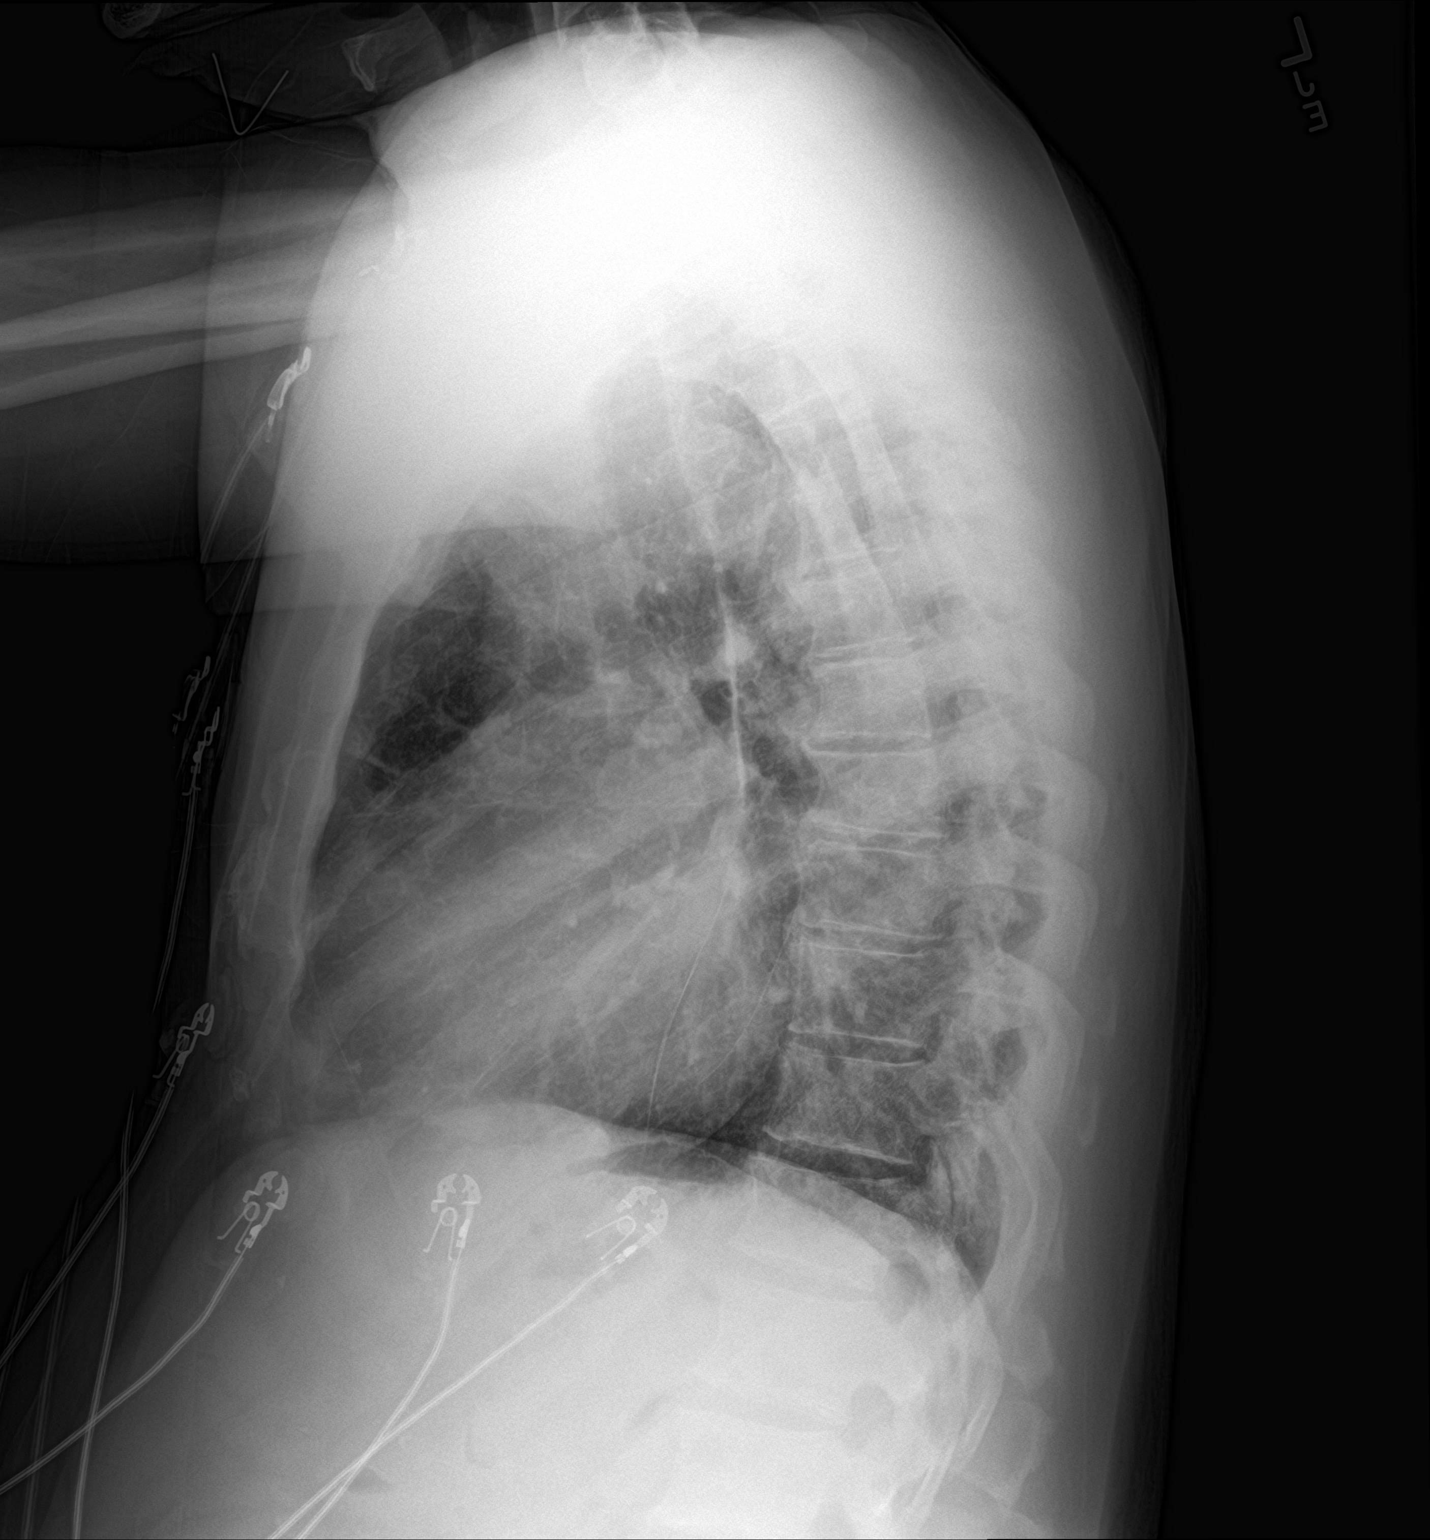

[2 of 2 positions shown; findings below may reference images not displayed]

FINDINGS: Upper normal heart size. Normal mediastinal contours with aortic
atherosclerosis. There is trace fluid in the fissures without
significant sub pulmonic effusion. No pulmonary edema. No focal
airspace disease. No pneumothorax. No acute osseous abnormalities
are seen.
IMPRESSION: Borderline cardiomegaly. Trace fluid in the fissures.

## 2023-07-22 NOTE — Telephone Encounter (Signed)
error
